# Patient Record
Sex: Male | Born: 1975 | Race: White | Hispanic: No | Marital: Married | Smoking: Never smoker
Health system: Southern US, Community
[De-identification: ages and names within clinical notes are randomized; demographics above are authoritative.]

## PROBLEM LIST (undated history)

## (undated) DIAGNOSIS — Z87442 Personal history of urinary calculi: Secondary | ICD-10-CM

## (undated) HISTORY — PX: WISDOM TOOTH EXTRACTION: SHX21

## (undated) HISTORY — PX: OTHER SURGICAL HISTORY: SHX169

---

## 1998-01-10 ENCOUNTER — Emergency Department (HOSPITAL_COMMUNITY): Admission: EM | Admit: 1998-01-10 | Discharge: 1998-01-10 | Payer: Self-pay | Admitting: Emergency Medicine

## 2012-02-28 ENCOUNTER — Emergency Department (HOSPITAL_COMMUNITY)
Admission: EM | Admit: 2012-02-28 | Discharge: 2012-02-29 | Disposition: A | Discharge status: Home / Self Care | Payer: BC Managed Care – PPO | Attending: Emergency Medicine | Admitting: Emergency Medicine

## 2012-02-28 ENCOUNTER — Emergency Department (HOSPITAL_COMMUNITY): Payer: BC Managed Care – PPO

## 2012-02-28 ENCOUNTER — Encounter (HOSPITAL_COMMUNITY): Payer: Self-pay | Admitting: Emergency Medicine

## 2012-02-28 DIAGNOSIS — N200 Calculus of kidney: Secondary | ICD-10-CM

## 2012-02-28 DIAGNOSIS — R109 Unspecified abdominal pain: Secondary | ICD-10-CM | POA: Insufficient documentation

## 2012-02-28 DIAGNOSIS — N201 Calculus of ureter: Secondary | ICD-10-CM | POA: Insufficient documentation

## 2012-02-28 LAB — URINALYSIS, ROUTINE W REFLEX MICROSCOPIC
Glucose, UA: NEGATIVE mg/dL
Leukocytes, UA: NEGATIVE
Nitrite: NEGATIVE
Specific Gravity, Urine: 1.033 — ABNORMAL HIGH (ref 1.005–1.030)
pH: 5 (ref 5.0–8.0)

## 2012-02-28 LAB — URINE MICROSCOPIC-ADD ON

## 2012-02-28 MED ORDER — HYDROMORPHONE HCL PF 1 MG/ML IJ SOLN
1.0000 mg | Freq: Once | INTRAMUSCULAR | Status: AC
  Administered 2012-02-28: 1 mg via INTRAVENOUS
  Filled 2012-02-28: qty 1

## 2012-02-28 MED ORDER — DEXTROSE 5 % IV SOLN
1.0000 g | Freq: Once | INTRAVENOUS | Status: AC
  Administered 2012-02-29: 1 g via INTRAVENOUS
  Filled 2012-02-28: qty 10

## 2012-02-28 MED ORDER — SODIUM CHLORIDE 0.9 % IV BOLUS (SEPSIS)
1000.0000 mL | Freq: Once | INTRAVENOUS | Status: AC
  Administered 2012-02-28: 1000 mL via INTRAVENOUS

## 2012-02-28 MED ORDER — ONDANSETRON HCL 4 MG/2ML IJ SOLN
4.0000 mg | Freq: Once | INTRAMUSCULAR | Status: AC
  Administered 2012-02-28: 4 mg via INTRAVENOUS
  Filled 2012-02-28: qty 2

## 2012-02-28 MED ORDER — SODIUM CHLORIDE 0.9 % IV SOLN
Freq: Once | INTRAVENOUS | Status: AC
  Administered 2012-02-28: 22:00:00 via INTRAVENOUS

## 2012-02-28 MED ORDER — SODIUM CHLORIDE 0.9 % IV BOLUS (SEPSIS)
1000.0000 mL | Freq: Once | INTRAVENOUS | Status: AC
  Administered 2012-02-29: 1000 mL via INTRAVENOUS

## 2012-02-28 NOTE — ED Notes (Signed)
Pt c/o L flank, LLQ and L testicle pain onset 2 hours, nausea.

## 2012-02-28 NOTE — ED Provider Notes (Signed)
History     CSN: [id number]  Arrival date & time 02/28/12  2131   First MD Initiated Contact with Patient 02/28/12 2201      Chief Complaint  Patient presents with  . Flank Pain    (Consider location/radiation/quality/duration/timing/severity/associated sxs/prior treatment) HPI History provided by pt.   Pt had acute onset severe L-sided flank pain, with radiation to L abdomen and testicles 2 hours ago.  Associated w/ N/V, diaphoresis and lightheadedness.  Had some relief w/ taking a hot bath.  Denies fever, diarrhea and urinary sx.  Denies recent injury.  Has never had pain like this before.   History reviewed. No pertinent past medical history.  Past Surgical History  Procedure Date  . Adnoids     No family history on file.  History  Substance Use Topics  . Smoking status: Never Smoker   . Smokeless tobacco: Not on file  . Alcohol Use: Yes     occasional      Review of Systems  All other systems reviewed and are negative.    Allergies  Review of patient's allergies indicates no known allergies.  Home Medications  No current outpatient prescriptions on file.  BP 135/79  Pulse 62  Temp 97.6 F (36.4 C) (Oral)  Resp 18  Wt 170 lb (77.111 kg)  SpO2 100%  Physical Exam  Nursing note and vitals reviewed. Constitutional: He is oriented to person, place, and time. He appears well-developed and well-nourished. No distress.       Uncomfortable appearing and diaphoretic  HENT:  Head: Normocephalic and atraumatic.  Eyes:       Normal appearance  Neck: Normal range of motion.  Cardiovascular: Normal rate and regular rhythm.   Pulmonary/Chest: Effort normal and breath sounds normal. No respiratory distress.  Abdominal: Soft. Bowel sounds are normal. He exhibits no distension and no mass. There is no tenderness. There is no rebound and no guarding.  Genitourinary:       No CVA tenderness.  Testicles descended bilaterally.  Both non-tender.   Musculoskeletal:  Normal range of motion.  Neurological: He is alert and oriented to person, place, and time.  Skin: Skin is warm and dry. No rash noted.  Psychiatric: He has a normal mood and affect. His behavior is normal.    ED Course  Procedures (including critical care time)  Labs Reviewed  URINALYSIS, ROUTINE W REFLEX MICROSCOPIC - Abnormal; Notable for the following:    Color, Urine AMBER (*)  BIOCHEMICALS MAY BE AFFECTED BY COLOR   APPearance CLOUDY (*)     Specific Gravity, Urine 1.033 (*)     Hgb urine dipstick MODERATE (*)     Bilirubin Urine SMALL (*)     Ketones, ur TRACE (*)     All other components within normal limits  URINE MICROSCOPIC-ADD ON - Abnormal; Notable for the following:    Bacteria, UA MANY (*)     All other components within normal limits  URINE CULTURE   Ct Abdomen Pelvis Wo Contrast  02/29/2012  *RADIOLOGY REPORT*  Clinical Data: Left flank pain  CT ABDOMEN AND PELVIS WITHOUT CONTRAST  Technique:  Multidetector CT imaging of the abdomen and pelvis was performed following the standard protocol without intravenous contrast.  Comparison: None.  Findings: Limited images through the lung bases demonstrate no significant appreciable abnormality. The heart size is within normal limits. No pleural or pericardial effusion.  Organ abnormality/lesion detection is limited in the absence of intravenous contrast. Within this limitation, unremarkable  liver, spleen, pancreas, biliary system, adrenal glands.  There is minimal left hydroureteronephrosis to the level of a 2 mm left UVJ stone.  No additional renal calculi.  No bowel obstruction.  No CT evidence for colitis.  Normal appendix.  No free intraperitoneal air or fluid.  No lymphadenopathy.  Normal caliber vasculature.  Decompressed bladder.  No acute osseous finding.  IMPRESSION: Mild left hydroureteronephrosis to the level of a 2 mm left UVJ stone.   Original Report Authenticated By: Waneta Martins, M.D.      1. Kidney stone        MDM  36yo M presents w/ acute onset severe L flank pain w/ radiation to abd and testicles.  Associated w/ diaphoresis and lightheadedness.  Unremarkable abdominal and testicular exam.  Pt appears very uncomfortable.  Suspect kidney stone + vasovagal reaction.  He has received IV fluids, dilaudid and zofran and sx are improved.  U/A and CT abd/pelvis w/out contrast ordered.  10:30 PM   U/A pos for infection.  Pt reports that pain and nausea have intensified.  Rocephin and a second round of fluids, dilaudid and zofran ordered. 11:18 PM   CT shows 2mm UVJ stone.  Results discussed w/ pt.  He has received a third round of medications and pain/nausea currently controlled.  He is tolerating pos.  D/c'd home w/ percocet, zofran and keflex as well as referral to urology.  Return precautions discussed. 1:46 AM    Otilio Miu, PA 02/29/12 [phone]

## 2012-02-29 MED ORDER — ONDANSETRON HCL 8 MG PO TABS: 8.0000 mg | ORAL_TABLET | Freq: Three times a day (TID) | ORAL | Status: AC | PRN

## 2012-02-29 MED ORDER — KETOROLAC TROMETHAMINE 30 MG/ML IJ SOLN
30.0000 mg | Freq: Once | INTRAMUSCULAR | Status: AC
  Administered 2012-02-29: 30 mg via INTRAVENOUS
  Filled 2012-02-29: qty 1

## 2012-02-29 MED ORDER — ONDANSETRON HCL 4 MG/2ML IJ SOLN
4.0000 mg | Freq: Once | INTRAMUSCULAR | Status: AC
  Administered 2012-02-29: 4 mg via INTRAVENOUS
  Filled 2012-02-29: qty 2

## 2012-02-29 MED ORDER — OXYCODONE-ACETAMINOPHEN 5-325 MG PO TABS: 1.0000 | ORAL_TABLET | ORAL | Status: AC | PRN

## 2012-02-29 MED ORDER — HYDROMORPHONE HCL PF 1 MG/ML IJ SOLN
1.0000 mg | Freq: Once | INTRAMUSCULAR | Status: AC
  Administered 2012-02-29: 1 mg via INTRAVENOUS
  Filled 2012-02-29: qty 1

## 2012-02-29 MED ORDER — CEPHALEXIN 500 MG PO CAPS: 500.0000 mg | ORAL_CAPSULE | Freq: Four times a day (QID) | ORAL | Status: AC

## 2012-03-01 LAB — URINE CULTURE: Culture: NO GROWTH

## 2012-03-01 NOTE — ED Provider Notes (Signed)
Medical screening examination/treatment/procedure(s) were performed by non-physician practitioner and as supervising physician I was immediately available for consultation/collaboration.  Amyra Vantuyl T Destanie Tibbetts, MD 03/01/12 0929 

## 2013-07-12 ENCOUNTER — Other Ambulatory Visit: Payer: Self-pay | Admitting: Orthopaedic Surgery

## 2013-07-13 ENCOUNTER — Encounter (HOSPITAL_BASED_OUTPATIENT_CLINIC_OR_DEPARTMENT_OTHER): Payer: Self-pay | Admitting: *Deleted

## 2013-07-13 NOTE — Progress Notes (Signed)
No labs needed

## 2013-07-13 NOTE — H&P (Signed)
Anthony Watkins is an 38 y.o. male.   Chief Complaint: Right knee ACL tear HPI: Anthony Watkins says his knee feels a bit unstable.  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  He does not trust his knee.  He would like to remain active athletically.  He has been through an MRI scan since the last time he was here.  His past medical, family, and social histories are reviewed and are unchanged based on his intake sheet from 06/03/13.  He has no drug allergies and is on no medications chronically. MRI:  I reviewed an MRI scan films and report of a study done at the [facility] on 06/13/13.  This shows a complete ACL tear with intact meniscal structures.   Past Medical History  Diagnosis Date  . History of kidney stones     Past Surgical History  Procedure Laterality Date  . Adnoids    . Wisdom tooth extraction      History reviewed. No pertinent family history. Social History:  reports that he has never smoked. He does not have any smokeless tobacco history on file. He reports that he drinks alcohol. He reports that he uses illicit drugs (Marijuana).  Allergies: No Known Allergies  No prescriptions prior to admission    No results found for this or any previous visit (from the past 48 hour(s)). No results found.  Review of Systems  Constitutional: Negative.   HENT: Negative.   Eyes: Negative.   Respiratory: Negative.   Cardiovascular: Negative.   Gastrointestinal: Negative.   Musculoskeletal: Positive for joint pain.  Skin: Negative.   Neurological: Negative.   Endo/Heme/Allergies: Negative.   Psychiatric/Behavioral: Negative.     Height 5\' 11"  (1.803 m), weight 77.111 kg (170 lb). Physical Exam  Constitutional: He is oriented to person, place, and time. He appears well-developed and well-nourished.  HENT:  Head: Normocephalic and atraumatic.  Eyes: Conjunctivae are normal. Pupils are equal, round, and reactive to light.  Neck: Normal range of motion. Neck supple.  Cardiovascular:  Normal rate, regular rhythm, normal heart sounds and intact distal pulses.   Respiratory: Effort normal and breath sounds normal.  GI: Soft. Bowel sounds are normal.  Musculoskeletal:  Right knee has trace effusion.  His motion is now about 5-110.  He has Lachman's test which feels loose.  He has some pain to valgus stress but no instability in that direction.  There is some mild medial joint line pain.  Sensation and motor function are intact in his feet with palpable pulses on both sides.  There is no palpable lymphadenopathy behind his knee.   Neurological: He is alert and oriented to person, place, and time. He has normal reflexes.  Skin: Skin is warm and dry.  Psychiatric: He has a normal mood and affect. His behavior is normal. Thought content normal.     Assessment/Plan Right knee ACL tear by MRI Anthony Watkins has a torn ACL.  He would like to get back to skateboarding and basketball and other pivoting sports.  I think the best option for him would be reconstruction.  I reviewed risk of anesthesia, infection, and DVT related to ACL reconstruction.  I think at age 38 an allograft would probably be his best option and I reviewed that in some detail.  He understands about the 6 month recovery process afterwards.    Anthony Watkins R 07/13/2013, 6:03 PM

## 2013-07-15 ENCOUNTER — Other Ambulatory Visit: Payer: Self-pay | Admitting: Orthopaedic Surgery

## 2013-07-19 ENCOUNTER — Ambulatory Visit (HOSPITAL_BASED_OUTPATIENT_CLINIC_OR_DEPARTMENT_OTHER)
Admission: RE | Admit: 2013-07-19 | Discharge: 2013-07-19 | Disposition: A | Discharge status: Home / Self Care | Payer: BC Managed Care – PPO | Source: Ambulatory Visit | Attending: Orthopaedic Surgery | Admitting: Orthopaedic Surgery

## 2013-07-19 ENCOUNTER — Encounter (HOSPITAL_BASED_OUTPATIENT_CLINIC_OR_DEPARTMENT_OTHER)
Admission: RE | Disposition: A | Discharge status: Home / Self Care | Payer: Self-pay | Source: Ambulatory Visit | Attending: Orthopaedic Surgery

## 2013-07-19 ENCOUNTER — Encounter (HOSPITAL_BASED_OUTPATIENT_CLINIC_OR_DEPARTMENT_OTHER): Payer: BC Managed Care – PPO | Admitting: Anesthesiology

## 2013-07-19 ENCOUNTER — Ambulatory Visit (HOSPITAL_BASED_OUTPATIENT_CLINIC_OR_DEPARTMENT_OTHER): Payer: BC Managed Care – PPO | Admitting: Anesthesiology

## 2013-07-19 ENCOUNTER — Encounter (HOSPITAL_BASED_OUTPATIENT_CLINIC_OR_DEPARTMENT_OTHER): Payer: Self-pay | Admitting: *Deleted

## 2013-07-19 DIAGNOSIS — S83509A Sprain of unspecified cruciate ligament of unspecified knee, initial encounter: Secondary | ICD-10-CM | POA: Insufficient documentation

## 2013-07-19 DIAGNOSIS — S83511A Sprain of anterior cruciate ligament of right knee, initial encounter: Secondary | ICD-10-CM

## 2013-07-19 DIAGNOSIS — X58XXXA Exposure to other specified factors, initial encounter: Secondary | ICD-10-CM | POA: Insufficient documentation

## 2013-07-19 DIAGNOSIS — M2240 Chondromalacia patellae, unspecified knee: Secondary | ICD-10-CM | POA: Insufficient documentation

## 2013-07-19 HISTORY — DX: Personal history of urinary calculi: Z87.442

## 2013-07-19 HISTORY — PX: KNEE ARTHROSCOPY WITH ANTERIOR CRUCIATE LIGAMENT (ACL) REPAIR: SHX5644

## 2013-07-19 LAB — POCT HEMOGLOBIN-HEMACUE: HEMOGLOBIN: 16.4 g/dL (ref 13.0–17.0)

## 2013-07-19 SURGERY — KNEE ARTHROSCOPY WITH ANTERIOR CRUCIATE LIGAMENT (ACL) REPAIR
Anesthesia: Regional | Site: Knee | Laterality: Right

## 2013-07-19 MED ORDER — PROPOFOL 10 MG/ML IV BOLUS
INTRAVENOUS | Status: DC | PRN
  Administered 2013-07-19: 200 mg via INTRAVENOUS

## 2013-07-19 MED ORDER — HYDROMORPHONE HCL PF 1 MG/ML IJ SOLN: 0.2500 mg | INTRAMUSCULAR | Status: DC | PRN

## 2013-07-19 MED ORDER — MIDAZOLAM HCL 2 MG/2ML IJ SOLN
INTRAMUSCULAR | Status: AC
  Filled 2013-07-19: qty 2

## 2013-07-19 MED ORDER — SODIUM CHLORIDE 0.9 % IR SOLN
Status: DC | PRN
  Administered 2013-07-19: 6000 mL

## 2013-07-19 MED ORDER — PROPOFOL 10 MG/ML IV EMUL
INTRAVENOUS | Status: AC
  Filled 2013-07-19: qty 50

## 2013-07-19 MED ORDER — SUCCINYLCHOLINE CHLORIDE 20 MG/ML IJ SOLN
INTRAMUSCULAR | Status: AC
  Filled 2013-07-19: qty 1

## 2013-07-19 MED ORDER — LACTATED RINGERS IV SOLN
INTRAVENOUS | Status: DC
  Administered 2013-07-19 (×2): via INTRAVENOUS

## 2013-07-19 MED ORDER — MIDAZOLAM HCL 5 MG/5ML IJ SOLN
INTRAMUSCULAR | Status: DC | PRN
  Administered 2013-07-19: 2 mg via INTRAVENOUS

## 2013-07-19 MED ORDER — OXYCODONE HCL 5 MG/5ML PO SOLN: 5.0000 mg | Freq: Once | ORAL | Status: AC | PRN

## 2013-07-19 MED ORDER — LACTATED RINGERS IV SOLN: INTRAVENOUS | Status: DC

## 2013-07-19 MED ORDER — CEFAZOLIN SODIUM-DEXTROSE 2-3 GM-% IV SOLR
2.0000 g | INTRAVENOUS | Status: AC
  Administered 2013-07-19: 2 g via INTRAVENOUS

## 2013-07-19 MED ORDER — HYDROCODONE-ACETAMINOPHEN 5-325 MG PO TABS: 1.0000 | ORAL_TABLET | Freq: Four times a day (QID) | ORAL | Status: DC | PRN

## 2013-07-19 MED ORDER — BUPIVACAINE-EPINEPHRINE PF 0.5-1:200000 % IJ SOLN
INTRAMUSCULAR | Status: DC | PRN
  Administered 2013-07-19: 30 mL via PERINEURAL

## 2013-07-19 MED ORDER — CEFAZOLIN SODIUM-DEXTROSE 2-3 GM-% IV SOLR
INTRAVENOUS | Status: AC
  Filled 2013-07-19: qty 50

## 2013-07-19 MED ORDER — KETOROLAC TROMETHAMINE 30 MG/ML IJ SOLN
INTRAMUSCULAR | Status: DC | PRN
  Administered 2013-07-19: 30 mg via INTRAVENOUS

## 2013-07-19 MED ORDER — OXYCODONE HCL 5 MG PO TABS
5.0000 mg | ORAL_TABLET | Freq: Once | ORAL | Status: AC | PRN
  Administered 2013-07-19: 5 mg via ORAL
  Filled 2013-07-19: qty 1

## 2013-07-19 MED ORDER — MIDAZOLAM HCL 2 MG/2ML IJ SOLN
1.0000 mg | INTRAMUSCULAR | Status: DC | PRN
  Administered 2013-07-19: 2 mg via INTRAVENOUS

## 2013-07-19 MED ORDER — LIDOCAINE HCL (CARDIAC) 20 MG/ML IV SOLN
INTRAVENOUS | Status: DC | PRN
  Administered 2013-07-19: 50 mg via INTRAVENOUS

## 2013-07-19 MED ORDER — SODIUM CHLORIDE 0.9 % IV SOLN
INTRAVENOUS | Status: DC | PRN
  Administered 2013-07-19: 14000 mL

## 2013-07-19 MED ORDER — FENTANYL CITRATE 0.05 MG/ML IJ SOLN
INTRAMUSCULAR | Status: AC
  Filled 2013-07-19: qty 2

## 2013-07-19 MED ORDER — DEXAMETHASONE SODIUM PHOSPHATE 4 MG/ML IJ SOLN
INTRAMUSCULAR | Status: DC | PRN
  Administered 2013-07-19: 10 mg via INTRAVENOUS

## 2013-07-19 MED ORDER — FENTANYL CITRATE 0.05 MG/ML IJ SOLN
INTRAMUSCULAR | Status: DC | PRN
  Administered 2013-07-19: 50 ug via INTRAVENOUS

## 2013-07-19 MED ORDER — FENTANYL CITRATE 0.05 MG/ML IJ SOLN
INTRAMUSCULAR | Status: AC
  Filled 2013-07-19: qty 6

## 2013-07-19 MED ORDER — FENTANYL CITRATE 0.05 MG/ML IJ SOLN
50.0000 ug | INTRAMUSCULAR | Status: DC | PRN
  Administered 2013-07-19: 100 ug via INTRAVENOUS

## 2013-07-19 MED ORDER — CHLORHEXIDINE GLUCONATE 4 % EX LIQD: 60.0000 mL | Freq: Once | CUTANEOUS | Status: DC

## 2013-07-19 MED ORDER — ONDANSETRON HCL 4 MG/2ML IJ SOLN
INTRAMUSCULAR | Status: DC | PRN
  Administered 2013-07-19: 4 mg via INTRAVENOUS

## 2013-07-19 SURGICAL SUPPLY — 79 items
BANDAGE ELASTIC 6 VELCRO ST LF (GAUZE/BANDAGES/DRESSINGS) ×2 IMPLANT
BANDAGE ESMARK 6X9 LF (GAUZE/BANDAGES/DRESSINGS) IMPLANT
BENZOIN TINCTURE PRP APPL 2/3 (GAUZE/BANDAGES/DRESSINGS) IMPLANT
BLADE AVERAGE 25X9 (BLADE) ×2 IMPLANT
BLADE CUDA 5.5 (BLADE) IMPLANT
BLADE GREAT WHITE 4.2 (BLADE) ×2 IMPLANT
BLADE OSCIL/SAGITTAL W/10 ST (BLADE) IMPLANT
BLADE SURG 15 STRL LF DISP TIS (BLADE) IMPLANT
BLADE SURG 15 STRL SS (BLADE)
BNDG ESMARK 6X9 LF (GAUZE/BANDAGES/DRESSINGS)
BNDG GAUZE ELAST 4 BULKY (GAUZE/BANDAGES/DRESSINGS) ×2 IMPLANT
BONE TUNNEL PLUG CANNULATED (MISCELLANEOUS) ×2 IMPLANT
BUR VERTEX HOODED 4.5 (BURR) ×2 IMPLANT
CANISTER SUCT 3000ML (MISCELLANEOUS) IMPLANT
COVER TABLE BACK 60X90 (DRAPES) ×2 IMPLANT
CUFF TOURNIQUET SINGLE 34IN LL (TOURNIQUET CUFF) IMPLANT
DECANTER SPIKE VIAL GLASS SM (MISCELLANEOUS) IMPLANT
DRAPE ARTHROSCOPY W/POUCH 114 (DRAPES) ×2 IMPLANT
DRAPE INCISE IOBAN 66X45 STRL (DRAPES) ×2 IMPLANT
DRAPE U-SHAPE 47X51 STRL (DRAPES) ×2 IMPLANT
DRSG EMULSION OIL 3X3 NADH (GAUZE/BANDAGES/DRESSINGS) ×4 IMPLANT
DURAPREP 26ML APPLICATOR (WOUND CARE) ×2 IMPLANT
ELECT MENISCUS 165MM 90D (ELECTRODE) IMPLANT
ELECT REM PT RETURN 9FT ADLT (ELECTROSURGICAL) ×2
ELECTRODE REM PT RTRN 9FT ADLT (ELECTROSURGICAL) ×1 IMPLANT
FIBERSTICK 2 (SUTURE) IMPLANT
GAUZE SPONGE 4X4 16PLY XRAY LF (GAUZE/BANDAGES/DRESSINGS) IMPLANT
GLOVE BIO SURGEON STRL SZ8.5 (GLOVE) ×2 IMPLANT
GLOVE BIOGEL PI IND STRL 7.0 (GLOVE) ×1 IMPLANT
GLOVE BIOGEL PI IND STRL 8 (GLOVE) ×1 IMPLANT
GLOVE BIOGEL PI IND STRL 8.5 (GLOVE) ×1 IMPLANT
GLOVE BIOGEL PI INDICATOR 7.0 (GLOVE) ×1
GLOVE BIOGEL PI INDICATOR 8 (GLOVE) ×1
GLOVE BIOGEL PI INDICATOR 8.5 (GLOVE) ×1
GLOVE ECLIPSE 6.5 STRL STRAW (GLOVE) ×2 IMPLANT
GLOVE EXAM NITRILE MD LF STRL (GLOVE) ×2 IMPLANT
GLOVE SS BIOGEL STRL SZ 8 (GLOVE) ×1 IMPLANT
GLOVE SUPERSENSE BIOGEL SZ 8 (GLOVE) ×1
GOWN STRL REUS W/ TWL LRG LVL3 (GOWN DISPOSABLE) ×1 IMPLANT
GOWN STRL REUS W/ TWL XL LVL3 (GOWN DISPOSABLE) ×1 IMPLANT
GOWN STRL REUS W/TWL LRG LVL3 (GOWN DISPOSABLE) ×1
GOWN STRL REUS W/TWL XL LVL3 (GOWN DISPOSABLE) ×1
IMMOBILIZER KNEE 22 (SOFTGOODS) IMPLANT
IMMOBILIZER KNEE 24 ADJ (MISCELLANEOUS) ×2 IMPLANT
IV NS IRRIG 3000ML ARTHROMATIC (IV SOLUTION) ×4 IMPLANT
KIT TRANSTIBIAL (DISPOSABLE) IMPLANT
KNEE WRAP E Z 3 GEL PACK (MISCELLANEOUS) ×2 IMPLANT
MANIFOLD NEPTUNE II (INSTRUMENTS) ×2 IMPLANT
NS IRRIG 1000ML POUR BTL (IV SOLUTION) ×2 IMPLANT
PACK ARTHROSCOPY DSU (CUSTOM PROCEDURE TRAY) ×2 IMPLANT
PACK BASIN DAY SURGERY FS (CUSTOM PROCEDURE TRAY) ×2 IMPLANT
PATELLA LIGAMENT BISECTED FR (Tissue) ×2 IMPLANT
PENCIL BUTTON HOLSTER BLD 10FT (ELECTRODE) ×2 IMPLANT
SCREW PROPEL 7X20MM (Screw) ×2 IMPLANT
SCREW PROPEL 7X25MM (Screw) ×2 IMPLANT
SET ARTHROSCOPY TUBING (MISCELLANEOUS) ×1
SET ARTHROSCOPY TUBING LN (MISCELLANEOUS) ×1 IMPLANT
SHEET MEDIUM DRAPE 40X70 STRL (DRAPES) ×2 IMPLANT
SLEEVE SCD COMPRESS KNEE MED (MISCELLANEOUS) ×2 IMPLANT
SPONGE GAUZE 4X4 12PLY (GAUZE/BANDAGES/DRESSINGS) ×2 IMPLANT
SPONGE LAP 4X18 X RAY DECT (DISPOSABLE) ×2 IMPLANT
STRIP CLOSURE SKIN 1/2X4 (GAUZE/BANDAGES/DRESSINGS) IMPLANT
SUCTION FRAZIER TIP 10 FR DISP (SUCTIONS) IMPLANT
SUT 2 FIBERLOOP 20 STRT BLUE (SUTURE)
SUT ETHILON 4 0 PS 2 18 (SUTURE) ×2 IMPLANT
SUT PDS AB 1 CT  36 (SUTURE) ×2
SUT PDS AB 1 CT 36 (SUTURE) ×2 IMPLANT
SUT STEEL 5 (SUTURE) ×2 IMPLANT
SUT VIC AB 0 CT1 27 (SUTURE)
SUT VIC AB 0 CT1 27XBRD ANBCTR (SUTURE) IMPLANT
SUT VIC AB 2-0 SH 27 (SUTURE)
SUT VIC AB 2-0 SH 27XBRD (SUTURE) IMPLANT
SUT VIC AB 3-0 FS2 27 (SUTURE) IMPLANT
SUTURE 2 FIBERLOOP 20 STRT BLU (SUTURE) IMPLANT
SYR 3ML 18GX1 1/2 (SYRINGE) IMPLANT
TOWEL OR 17X24 6PK STRL BLUE (TOWEL DISPOSABLE) ×2 IMPLANT
TOWEL OR NON WOVEN STRL DISP B (DISPOSABLE) ×2 IMPLANT
WAND 30 DEG SABER W/CORD (SURGICAL WAND) IMPLANT
WATER STERILE IRR 1000ML POUR (IV SOLUTION) ×2 IMPLANT

## 2013-07-19 NOTE — Discharge Instructions (Addendum)
CPM-0-60 and advance as tolerated. May use this machine 6 hours per day. May change dressing as needed. If no drainage leave dressing on until follow-up appointment. Do not get incisions wet until seen for follow-up. Continue ice and elevation. May weight-bear as tolerated crutches as needed.  Post Anesthesia Home Care Instructions  Activity: Get plenty of rest for the remainder of the day. A responsible adult should stay with you for 24 hours following the procedure.  For the next 24 hours, DO NOT: -Drive a car -Advertising copywriterperate machinery -Drink alcoholic beverages -Take any medication unless instructed by your physician -Make any legal decisions or sign important papers.  Meals: Start with liquid foods such as gelatin or soup. Progress to regular foods as tolerated. Avoid greasy, spicy, heavy foods. If nausea and/or vomiting occur, drink only clear liquids until the nausea and/or vomiting subsides. Call your physician if vomiting continues.  Special Instructions/Symptoms: Your throat may feel dry or sore from the anesthesia or the breathing tube placed in your throat during surgery. If this causes discomfort, gargle with warm salt water. The discomfort should disappear within 24 hours.   Regional Anesthesia Blocks  1. Numbness or the inability to move the "blocked" extremity may last from 3-48 hours after placement. The length of time depends on the medication injected and your individual response to the medication. If the numbness is not going away after 48 hours, call your surgeon.  2. The extremity that is blocked will need to be protected until the numbness is gone and the  Strength has returned. Because you cannot feel it, you will need to take extra care to avoid injury. Because it may be weak, you may have difficulty moving it or using it. You may not know what position it is in without looking at it while the block is in effect.  3. For blocks in the legs and feet, returning to weight  bearing and walking needs to be done carefully. You will need to wait until the numbness is entirely gone and the strength has returned. You should be able to move your leg and foot normally before you try and bear weight or walk. You will need someone to be with you when you first try to ensure you do not fall and possibly risk injury.  4. Bruising and tenderness at the needle site are common side effects and will resolve in a few days.  5. Persistent numbness or new problems with movement should be communicated to the surgeon or the [facility] [phone])/ [facility] [phone]).     Arthroscopic Knee Surgery Post -OP Instructions  You have just had an arthroscopic operation. Your incisions (puncture sites) are small and should heal quickly. The structures inside your knee may take 6-8 weeks to heal and settle down. This healing time is variable and may range from a few days to 6-8 weeks.  Pain Medication: You will be given a prescription for pain medication. Please take the medication as needed. Most patients require pain medication for only a few days.  Swelling: You can expect some swelling in your knee. Applying ice and elevating your leg will help keep the swelling to a minimum. Ice can be applied by placing ice cubes in a plastic bag and putting the bag on your knee with a towel between the ice bag and your knee. Sometimes you will be issued an ice pack wrap and that will work as well. Your entire leg should be elevated, not  just your knee. Elevate your leg to or above the level of your heart. The ice should be used periodically during the first 48 hours along with continued elevation of your leg. The swelling should subside over the next several weeks.  Dressing: Fluid leakage is common the first night. Precautions to prevent staining of your clothes, bed sheets, etc., should be taken. This fluid was used to inflate the joint during surgery and is  commonly tinged red from a small amount of blood. Remove your dressing tomorrow. Some bleeding or leakage from the puncture sites may occur for a few days and you should cover the puncture sites with Band-Aids until the leakage stops.Alternatively you may reapply the ace wrap with some gauze pads over the puncture sites but don't wrap it too tightly.  You may shower 2 days after surgery.   Activity: You may bend and straighten your knee as soon as it is comfortable to do so. Using your knee will help decrease swelling and help prevent stiffness, as long as you dont overdo it. Moving your foot up and down also helps decrease the swelling. There is no harm in putting weight on; your leg as long as it is comfortable to do so. (You should not, try to run or jump). Gradually increase activity as you can tolerate. An increase in pain or swelling with certain activities or with increased activities may indicate you are doing too much. Back up and start building up your activities at a slower rate. You may need crutches for a few days.  Office Check-Up: If no major problems arise and you are progressing well, we will need to see you in the office in one (1) week unless otherwise instructed by your doctor. Please call the office to make an appointment.

## 2013-07-19 NOTE — Progress Notes (Signed)
Assisted Dr. Fitzgerald with right, ultrasound guided, femoral block. Side rails up, monitors on throughout procedure. See vital signs in flow sheet. Tolerated Procedure well. 

## 2013-07-19 NOTE — Interval H&P Note (Signed)
History and Physical Interval Note:  07/19/2013 7:23 AM  Anthony Watkins  has presented today for surgery, with the diagnosis of RIGHT ACL TEAR;CHONDROMALACIA  The various methods of treatment have been discussed with the patient and family. After consideration of risks, benefits and other options for treatment, the patient has consented to  Procedure(s): RIGHT KNEE ARTHROSCOPY WITH ANTERIOR CRUCIATE LIGAMENT (ACL) REPAIR (Right) as a surgical intervention .  The patient's history has been reviewed, patient examined, no change in status, stable for surgery.  I have reviewed the patient's chart and labs.  Questions were answered to the patient's satisfaction.     Larue Lightner G

## 2013-07-19 NOTE — Anesthesia Preprocedure Evaluation (Signed)
Anesthesia Evaluation  Patient identified by MRN, date of birth, ID band Patient awake    Reviewed: Allergy & Precautions, H&P , NPO status , Patient's Chart, lab work & pertinent test results  Airway Mallampati: II TM Distance: >3 FB Neck ROM: Full    Dental no notable dental hx. (+) Teeth Intact and Dental Advisory Given   Pulmonary neg pulmonary ROS,  breath sounds clear to auscultation  Pulmonary exam normal       Cardiovascular negative cardio ROS  Rate:Normal     Neuro/Psych negative neurological ROS  negative psych ROS   GI/Hepatic negative GI ROS, Neg liver ROS,   Endo/Other  negative endocrine ROS  Renal/GU negative Renal ROS  negative genitourinary   Musculoskeletal   Abdominal   Peds  Hematology negative hematology ROS (+)   Anesthesia Other Findings   Reproductive/Obstetrics negative OB ROS                           Anesthesia Physical Anesthesia Plan  ASA: I  Anesthesia Plan: General and Regional   Post-op Pain Management:    Induction: Intravenous  Airway Management Planned: LMA  Additional Equipment:   Intra-op Plan:   Post-operative Plan: Extubation in OR  Informed Consent: I have reviewed the patients History and Physical, chart, labs and discussed the procedure including the risks, benefits and alternatives for the proposed anesthesia with the patient or authorized representative who has indicated his/her understanding and acceptance.   Dental advisory given  Plan Discussed with: CRNA  Anesthesia Plan Comments:         Anesthesia Quick Evaluation

## 2013-07-19 NOTE — Anesthesia Procedure Notes (Addendum)
Anesthesia Regional Block:  Femoral nerve block  Pre-Anesthetic Checklist: ,, timeout performed, Correct Patient, Correct Site, Correct Laterality, Correct Procedure, Correct Position, site marked, Risks and benefits discussed, pre-op evaluation,  At surgeon's request and post-op pain management  Laterality: Right  Prep: Maximum Sterile Barrier Precautions used and chloraprep       Needles:  Injection technique: Single-shot  Needle Type: Echogenic Stimulator Needle     Needle Length: 5cm 5 cm Needle Gauge: 22 and 22 G    Additional Needles:  Procedures: ultrasound guided (picture in chart) Femoral nerve block Narrative:  Start time: 07/19/2013 6:52 AM End time: 07/19/2013 7:02 AM Injection made incrementally with aspirations every 5 mL. Anesthesiologist: Fitzgerald,MD  Additional Notes: 2% Lidocaine skin wheel.    Procedure Name: LMA Insertion Date/Time: 07/19/2013 7:38 AM Performed by: Zenia ResidesPAYNE, Turrell Severt D Pre-anesthesia Checklist: Patient identified, Emergency Drugs available, Suction available and Patient being monitored Patient Re-evaluated:Patient Re-evaluated prior to inductionOxygen Delivery Method: Circle System Utilized Preoxygenation: Pre-oxygenation with 100% oxygen Intubation Type: IV induction Ventilation: Mask ventilation without difficulty LMA: LMA inserted LMA Size: 4.0 Grade View: Grade II Number of attempts: 1 Airway Equipment and Method: bite block Placement Confirmation: positive ETCO2 and breath sounds checked- equal and bilateral Tube secured with: Tape Dental Injury: Teeth and Oropharynx as per pre-operative assessment

## 2013-07-19 NOTE — Anesthesia Postprocedure Evaluation (Signed)
  Anesthesia Post-op Note  Patient: Anthony LyeJoshua A Huyett  Procedure(s) Performed: Procedure(s): RIGHT KNEE ARTHROSCOPY WITH ANTERIOR CRUCIATE LIGAMENT (ACL) REPAIR (Right)  Patient Location: PACU  Anesthesia Type:General and block  Level of Consciousness: awake and alert   Airway and Oxygen Therapy: Patient Spontanous Breathing  Post-op Pain: mild  Post-op Assessment: Post-op Vital signs reviewed, Patient's Cardiovascular Status Stable and Respiratory Function Stable  Post-op Vital Signs: Reviewed  Filed Vitals:   07/19/13 0945  BP: 129/87  Pulse: 72  Temp:   Resp: 13    Complications: No apparent anesthesia complications

## 2013-07-19 NOTE — Transfer of Care (Signed)
Immediate Anesthesia Transfer of Care Note  Patient: Anthony LyeJoshua A Shostak  Procedure(s) Performed: Procedure(s): RIGHT KNEE ARTHROSCOPY WITH ANTERIOR CRUCIATE LIGAMENT (ACL) REPAIR (Right)  Patient Location: PACU  Anesthesia Type:General and Regional  Level of Consciousness: awake  Airway & Oxygen Therapy: Patient Spontanous Breathing and Patient connected to face mask oxygen  Post-op Assessment: Report given to PACU RN and Post -op Vital signs reviewed and stable  Post vital signs: Reviewed and stable  Complications: No apparent anesthesia complications

## 2013-07-19 NOTE — Op Note (Signed)
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 male with an unstable knee.  The patient has failed non-operative measures and has a knee that does not allow for participation in desired activities.  The patient is offered ACL reconstruction in hopes of stabilizing the knee.  Associated conditions are to be addressed as well.  Informed operative consent was obtained after discussion of risks including reaction to anesthesia, infection, DVT, and stiffness.  The importance of the post-operative rehabilitation protocol to optimize result was stressed extensively with the patient.  SUMMARY OF FINDINGS AND PROCEDURE:  Anthony Watkins was taken to the operative suite where under the above anesthesia a knee arthroscopy and ACL reconstruction was performed. The suprapatellar pouch was benign while the patellofemoral joint showed mild articular cartilage damage.  The medial compartment was notable for no articular cartilage damage and no meniscal pathology.  The ACL was torn and the PCL was intact.  The lateral compartment was notable for noarticular cartilage damage and no meniscal pathology.  The meniscal and articular cartilage problems were addressed with chondroplasty of the patella. We used patellar tendon allograft material and stabilized at both ends with metal L[employer] screws.   Anthony Pate PA assisted throughout and was invaluable to the completion of the case in that he positioned and retracted and also fashioned the graft on the back table while I performed arthroscopic portions of the case thereby significantly minimizing OR time.  The patient was scheduled to stay overnight at but might go home depending on condition in the recovery  room.  DESCRIPTION OF PROCEDURE:  Anthony Watkins was taken to the operative suite where the above anesthetic was applied.  The patient was positioned supine and prepped and draped in normal sterile fashion.  An appropriate time out was performed.  After the administration of Kefzol pre-operative antibiotic and arthroscopy of the knee was performed. Findings were as noted above and appropriate articular and meniscal cartilage work was done.  The ACL reconstruction was then performed utilizing the above mentioned material.  We harvested the middle third of the patellar tendon through a longitudinal incision and dissection through peritenon.  A saw was used to create contiguous bone plugs from the tibial tubercle and patella. A conservative notch-plasty was done with a burr.  A tourniquet was not utilized.  We prepared the aforementioned graft with saw and drill to fit through planned tunnels and bone plugs were fashioned to be one mm smaller than tunnels.  A guide was placed in the knee anterior to the PCL near the ACL footprint and utilized to place a guide wire up into the knee.  Over this I reamed to a diameter of 11 mm.  A second guide was placed through the medial portal low on the femur at the ACL footprint there and utilized for placement of a guide pin through the femur and out the lateral thigh.  Over this I reamed a femoral tunnel to a diameter of 9.5 mm and depth of 2 cm.  Bony debris was removed from the knee with the shaver.  The aforementioned graft was pulled through the tibial tunnel into the femoral tunnel with care taken to keep the tendinous portion of the graft in an anterior position as  it entered the femoral tunnel.  I placed a guidewire anterior in the femoral tunnel and over this placed a 7x20 mm interference screw.  The knee was ranged and the graft was felt to be very isometric.  Another guidewire was placed through the tibial tunnel and seen to enter the knee arthroscopically.  Over this  I placed another interference screw which was  7x25 mm in size.  The knee was again ranged and easily came to full extension with no impingement.  Arthroscopic equipment was removed at this point.  In case of patellar tendon autograft peritenon was closed with #0 vicryl followed by subcutaneous re-approximation in both allograft and autograft cases using 2-0 undyed vicryl and skin closure with nylon.  Adaptic was applied along with a sterile dressing.  Estimated blood loss and intraoperative fluids can be obtained from anesthesia records.  DISPOSITION:  The patient was extubated in the operating room and taken to recovery room in stable condition.  Plans were to stay overnight though the patient might be able to go home same day depending on condition in recovery.    Aislin Onofre G 07/19/2013, 8:53 AM

## 2013-07-21 ENCOUNTER — Encounter (HOSPITAL_BASED_OUTPATIENT_CLINIC_OR_DEPARTMENT_OTHER): Payer: Self-pay | Admitting: Orthopaedic Surgery

## 2014-05-03 ENCOUNTER — Ambulatory Visit (INDEPENDENT_AMBULATORY_CARE_PROVIDER_SITE_OTHER): Payer: BC Managed Care – PPO

## 2014-05-03 ENCOUNTER — Ambulatory Visit (INDEPENDENT_AMBULATORY_CARE_PROVIDER_SITE_OTHER): Payer: BC Managed Care – PPO | Admitting: Emergency Medicine

## 2014-05-03 VITALS — BP 124/74 | HR 90 | Temp 98.0°F | Resp 16 | Ht 70.0 in | Wt 163.0 lb

## 2014-05-03 DIAGNOSIS — J22 Unspecified acute lower respiratory infection: Secondary | ICD-10-CM

## 2014-05-03 DIAGNOSIS — R0989 Other specified symptoms and signs involving the circulatory and respiratory systems: Secondary | ICD-10-CM

## 2014-05-03 DIAGNOSIS — R5383 Other fatigue: Secondary | ICD-10-CM

## 2014-05-03 DIAGNOSIS — R05 Cough: Secondary | ICD-10-CM

## 2014-05-03 DIAGNOSIS — R0602 Shortness of breath: Secondary | ICD-10-CM

## 2014-05-03 DIAGNOSIS — R062 Wheezing: Secondary | ICD-10-CM

## 2014-05-03 DIAGNOSIS — Z9889 Other specified postprocedural states: Secondary | ICD-10-CM | POA: Insufficient documentation

## 2014-05-03 LAB — POCT CBC
Granulocyte percent: 57.5 %G (ref 37–80)
HEMATOCRIT: 53.4 % (ref 43.5–53.7)
HEMOGLOBIN: 17.9 g/dL (ref 14.1–18.1)
LYMPH, POC: 2.4 (ref 0.6–3.4)
MCH, POC: 30.7 pg (ref 27–31.2)
MCHC: 33.5 g/dL (ref 31.8–35.4)
MCV: 91.8 fL (ref 80–97)
MID (cbc): 0.7 (ref 0–0.9)
MPV: 8.4 fL (ref 0–99.8)
POC GRANULOCYTE: 4.2 (ref 2–6.9)
POC LYMPH %: 32.6 % (ref 10–50)
POC MID %: 9.9 %M (ref 0–12)
Platelet Count, POC: 224 10*3/uL (ref 142–424)
RBC: 5.82 M/uL (ref 4.69–6.13)
RDW, POC: 12.6 %
WBC: 7.3 10*3/uL (ref 4.6–10.2)

## 2014-05-03 MED ORDER — ALBUTEROL SULFATE HFA 108 (90 BASE) MCG/ACT IN AERS: 2.0000 | INHALATION_SPRAY | RESPIRATORY_TRACT | Status: AC | PRN

## 2014-05-03 MED ORDER — IPRATROPIUM BROMIDE 0.02 % IN SOLN
0.5000 mg | Freq: Once | RESPIRATORY_TRACT | Status: AC
  Administered 2014-05-03: 0.5 mg via RESPIRATORY_TRACT

## 2014-05-03 MED ORDER — PREDNISONE 20 MG PO TABS: ORAL_TABLET | ORAL | Status: DC

## 2014-05-03 MED ORDER — ALBUTEROL SULFATE (2.5 MG/3ML) 0.083% IN NEBU
2.5000 mg | INHALATION_SOLUTION | Freq: Once | RESPIRATORY_TRACT | Status: AC
  Administered 2014-05-03: 2.5 mg via RESPIRATORY_TRACT

## 2014-05-03 MED ORDER — AZITHROMYCIN 250 MG PO TABS: ORAL_TABLET | ORAL | Status: DC

## 2014-05-03 NOTE — Patient Instructions (Signed)
Albuterol inhalation aerosol °What is this medicine? °ALBUTEROL (al BYOO ter ole) is a bronchodilator. It helps open up the airways in your lungs to make it easier to breathe. This medicine is used to treat and to prevent bronchospasm. °This medicine may be used for other purposes; ask your health care provider or pharmacist if you have questions. °COMMON BRAND NAME(S): Proair HFA, Proventil, Proventil HFA, Respirol, Ventolin, Ventolin HFA °What should I tell my health care provider before I take this medicine? °They need to know if you have any of the following conditions: °-diabetes °-heart disease or irregular heartbeat °-high blood pressure °-pheochromocytoma °-seizures °-thyroid disease °-an unusual or allergic reaction to albuterol, levalbuterol, sulfites, other medicines, foods, dyes, or preservatives °-pregnant or trying to get pregnant °-breast-feeding °How should I use this medicine? °This medicine is for inhalation through the mouth. Follow the directions on your prescription label. Take your medicine at regular intervals. Do not use more often than directed. Make sure that you are using your inhaler correctly. Ask you doctor or health care provider if you have any questions. °Talk to your pediatrician regarding the use of this medicine in children. Special care may be needed. °Overdosage: If you think you have taken too much of this medicine contact a poison control center or emergency room at once. °NOTE: This medicine is only for you. Do not share this medicine with others. °What if I miss a dose? °If you miss a dose, use it as soon as you can. If it is almost time for your next dose, use only that dose. Do not use double or extra doses. °What may interact with this medicine? °-anti-infectives like chloroquine and pentamidine °-caffeine °-cisapride °-diuretics °-medicines for colds °-medicines for depression or for emotional or psychotic conditions °-medicines for weight loss including some herbal  products °-methadone °-some antibiotics like clarithromycin, erythromycin, levofloxacin, and linezolid °-some heart medicines °-steroid hormones like dexamethasone, cortisone, hydrocortisone °-theophylline °-thyroid hormones °This list may not describe all possible interactions. Give your health care provider a list of all the medicines, herbs, non-prescription drugs, or dietary supplements you use. Also tell them if you smoke, drink alcohol, or use illegal drugs. Some items may interact with your medicine. °What should I watch for while using this medicine? °Tell your doctor or health care professional if your symptoms do not improve. Do not use extra albuterol. If your asthma or bronchitis gets worse while you are using this medicine, call your doctor right away. °If your mouth gets dry try chewing sugarless gum or sucking hard candy. Drink water as directed. °What side effects may I notice from receiving this medicine? °Side effects that you should report to your doctor or health care professional as soon as possible: °-allergic reactions like skin rash, itching or hives, swelling of the face, lips, or tongue °-breathing problems °-chest pain °-feeling faint or lightheaded, falls °-high blood pressure °-irregular heartbeat °-fever °-muscle cramps or weakness °-pain, tingling, numbness in the hands or feet °-vomiting °Side effects that usually do not require medical attention (report to your doctor or health care professional if they continue or are bothersome): °-cough °-difficulty sleeping °-headache °-nervousness or trembling °-stomach upset °-stuffy or runny nose °-throat irritation °-unusual taste °This list may not describe all possible side effects. Call your doctor for medical advice about side effects. You may report side effects to FDA at 1-800-FDA-1088. °Where should I keep my medicine? °Keep out of the reach of children. °Store at room temperature between 15 and 30 degrees   C (59 and 86 degrees F). The  contents are under pressure and may burst when exposed to heat or flame. Do not freeze. This medicine does not work as well if it is too cold. Throw away any unused medicine after the expiration date. Inhalers need to be thrown away after the labeled number of puffs have been used or by the expiration date; whichever comes first. Ventolin HFA should be thrown away 12 months after removing from foil pouch. Check the instructions that come with your medicine. °NOTE: This sheet is a summary. It may not cover all possible information. If you have questions about this medicine, talk to your doctor, pharmacist, or health care provider. °© 2015, [employer]. (2012-11-18 10:57:17) ° ° °

## 2014-05-03 NOTE — Progress Notes (Signed)
Subjective:    Patient ID: Anthony LyeJoshua A Caldera, male    DOB: 07-19-1975, 38 y.o.   MRN: [id number]  HPI Patient presents with 13 days of of productive cough that has gotten progressively worse. Sx started Nov. 5 (the day before his wedding) with rhinorrhea and sneezing, but has progressed to cough and difficulty breathing and wheezing when laying down. Denies fever, chills, or sinus pressure. 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 Denies h/o of asthma, but has seasonal allergies that are controlled. Due to living situation has been sleeping on the couch where dog usually sleeps for past week. Has tried DayQuil with no relief and AlkaSeltzer that is helping him sleep. No allergies to any medications.  PFSH reviewed for this encounter.   Review of Systems  Constitutional: Positive for fatigue. Negative for fever, chills, activity change and appetite change.  HENT: Positive for congestion, rhinorrhea and sneezing. Negative for ear discharge, ear pain, sinus pressure and sore throat.   Eyes: Negative for pain, discharge and itching.  Respiratory: Positive for cough, shortness of breath (when laying down) and wheezing (when laying down). Negative for chest tightness.   Cardiovascular: Negative for chest pain.  Gastrointestinal: Negative for nausea, vomiting and abdominal pain.  Musculoskeletal: Negative for myalgias, neck pain and neck stiffness.  Allergic/Immunologic: Positive for environmental allergies. Negative for food allergies.  Neurological: Negative for dizziness, light-headedness and headaches.  Hematological: Negative for adenopathy.       Objective:   Physical Exam  Constitutional: He is oriented to person, place, and time. He appears well-developed and well-nourished. No distress.  Blood pressure 124/74, pulse 90, temperature 98 F (36.7 C), temperature source Oral, resp. rate 16, height 5\' 10"  (1.778 m), weight 163 lb (73.936 kg), SpO2 98 %.   HENT:  Head: Normocephalic  and atraumatic.  Right Ear: External ear normal.  Left Ear: External ear normal.  Mouth/Throat: Oropharynx is clear and moist. No oropharyngeal exudate.  Eyes: Conjunctivae are normal. Pupils are equal, round, and reactive to light. Right eye exhibits no discharge. Left eye exhibits no discharge. No scleral icterus.  Neck: Normal range of motion. Neck supple.  Cardiovascular: Normal rate, regular rhythm and normal heart sounds.  Exam reveals no gallop and no friction rub.   No murmur heard. Pulmonary/Chest: Effort normal. No respiratory distress. He has no decreased breath sounds. He has wheezes in the left middle field. He has no rhonchi. He has rales in the right middle field, the right lower field, the left middle field and the left lower field.  Abdominal: Soft. Bowel sounds are normal. He exhibits no mass. There is no tenderness.  Lymphadenopathy:    He has no cervical adenopathy.  Neurological: He is alert and oriented to person, place, and time.  Skin: Skin is warm and dry. No rash noted. He is not diaphoretic. No erythema. No pallor.   [facility] reading (PRIMARY) by  Dr. Cleta Albertsaub. Increased lower lobe markings.   Results for orders placed or performed in visit on 05/03/14  POCT CBC  Result Value Ref Range   WBC 7.3 4.6 - 10.2 K/uL   Lymph, poc 2.4 0.6 - 3.4   POC LYMPH PERCENT 32.6 10 - 50 %L   MID (cbc) 0.7 0 - 0.9   POC MID % 9.9 0 - 12 %M   POC Granulocyte 4.2 2 - 6.9   Granulocyte percent 57.5 37 - 80 %G   RBC 5.82 4.69 - 6.13 M/uL   Hemoglobin 17.9 14.1 - 18.1  g/dL   HCT, POC 16.153.4 09.643.5 - 53.7 %   MCV 91.8 80 - 97 fL   MCH, POC 30.7 27 - 31.2 pg   MCHC 33.5 31.8 - 35.4 g/dL   RDW, POC 04.512.6 %   Platelet Count, POC 224.0 142 - 424 K/uL   MPV 8.4 0 - 99.8 fL      Assessment & Plan:  1. Respiratory crackles - DG Chest 2 View; Future - POCT CBC - albuterol (PROVENTIL) (2.5 MG/3ML) 0.083% nebulizer solution 2.5 mg; Take 3 mLs (2.5 mg total) by nebulization once. - ipratropium  (ATROVENT) nebulizer solution 0.5 mg; Take 2.5 mLs (0.5 mg total) by nebulization once.  2. Acute lower respiratory tract infection - albuterol (PROVENTIL HFA;VENTOLIN HFA) 108 (90 BASE) MCG/ACT inhaler; Inhale 2 puffs into the lungs every 4 (four) hours as needed for wheezing or shortness of breath (cough, shortness of breath or wheezing.).  Dispense: 1 Inhaler; Refill: 1 - azithromycin (ZITHROMAX) 250 MG tablet; Take 2 tabs PO x 1 dose, then 1 tab PO QD x 4 days  Dispense: 6 tablet; Refill: 0 - predniSONE (DELTASONE) 20 MG tablet; Take 3 PO QAM x2days, 2 PO QAM x2days, 1 PO QAM x2days  Dispense: 12 tablet; Refill: 0   Mackensi Mahadeo PA-C  [facility] 05/03/2014 2:53 PM

## 2014-07-17 ENCOUNTER — Encounter: Payer: Self-pay | Admitting: Family Medicine

## 2014-07-17 ENCOUNTER — Ambulatory Visit (INDEPENDENT_AMBULATORY_CARE_PROVIDER_SITE_OTHER): Payer: BLUE CROSS/BLUE SHIELD | Admitting: Family Medicine

## 2014-07-17 VITALS — BP 136/78 | HR 71 | Temp 97.8°F | Resp 16 | Ht 70.5 in | Wt 168.0 lb

## 2014-07-17 DIAGNOSIS — R059 Cough, unspecified: Secondary | ICD-10-CM

## 2014-07-17 DIAGNOSIS — R05 Cough: Secondary | ICD-10-CM

## 2014-07-17 NOTE — Patient Instructions (Signed)
Try over the counter Zyrtec or Claritin (generic is fine) every day for 1 month Take Mucinex and really push fluids for the next 5-7 days Try to use albuterol inhaler before bed and try Delsym cough suppressant If you are not considerably better in 2 weeks, lets consider rechecking a chest XRAY. Cough, Adult  A cough is a reflex that helps clear your throat and airways. It can help heal the body or may be a reaction to an irritated airway. A cough may only last 2 or 3 weeks (acute) or may last more than 8 weeks (chronic).  CAUSES Acute cough:  Viral or bacterial infections. Chronic cough:  Infections.  Allergies.  Asthma.  Post-nasal drip.  Smoking.  Heartburn or acid reflux.  Some medicines.  Chronic lung problems (COPD).  Cancer. SYMPTOMS   Cough.  Fever.  Chest pain.  Increased breathing rate.  High-pitched whistling sound when breathing (wheezing).  Colored mucus that you cough up (sputum). TREATMENT   A bacterial cough may be treated with antibiotic medicine.  A viral cough must run its course and will not respond to antibiotics.  Your caregiver may recommend other treatments if you have a chronic cough. HOME CARE INSTRUCTIONS   Only take over-the-counter or prescription medicines for pain, discomfort, or fever as directed by your caregiver. Use cough suppressants only as directed by your caregiver.  Use a cold steam vaporizer or humidifier in your bedroom or home to help loosen secretions.  Sleep in a semi-upright position if your cough is worse at night.  Rest as needed.  Stop smoking if you smoke. SEEK IMMEDIATE MEDICAL CARE IF:   You have pus in your sputum.  Your cough starts to worsen.  You cannot control your cough with suppressants and are losing sleep.  You begin coughing up blood.  You have difficulty breathing.  You develop pain which is getting worse or is uncontrolled with medicine.  You have a fever. MAKE SURE YOU:    Understand these instructions.  Will watch your condition.  Will get help right away if you are not doing well or get worse. Document Released: 11/29/2010 Document Revised: 08/25/2011 Document Reviewed: 11/29/2010 [facility] Patient Information 2015 [employer] [place]. This information is not intended to replace advice given to you by your health care provider. Make sure you discuss any questions you have with your health care provider.

## 2014-07-17 NOTE — Progress Notes (Signed)
   Subjective:    Patient ID: Anthony Watkins, male    DOB: Aug 26, 1975, 39 y.o.   MRN: [id number]  HPI Patient presents today with continued cough. He was seen 11/15 with URI and was given antibiotic and albuterol. He has continued to cough periodically and have nasal drainage and congestion. He is not currently taking any medication or using his albuterol. Does not drink much liquid other than coffee throughout the day.  He notes that they have been constantly working on his house with painting and other work.  He never has any shortness of breath or cough during the day. Is waking up in the middle of the night with a cough, and feeling like something is in his chest that needs to be expectorated. He reports that he feels better this week. He denies reflux symptoms. He otherwise feels fine except for this intermittent, night time cough. He is exercising strenuously without cough, SOB, wheeze.   Past Medical History  Diagnosis Date  . History of kidney stones    Past Surgical History  Procedure Laterality Date  . Adnoids    . Wisdom tooth extraction    . Knee arthroscopy with anterior cruciate ligament (acl) repair Right 07/19/2013    Procedure: RIGHT KNEE ARTHROSCOPY WITH ANTERIOR CRUCIATE LIGAMENT (ACL) REPAIR;  Surgeon: Velna OchsPeter G Dalldorf, MD;  Location: [facility];  Service: Orthopedics;  Laterality: Right;   No family history on file. History  Substance Use Topics  . Smoking status: Never Smoker   . Smokeless tobacco: Never Used  . Alcohol Use: Yes     Comment: occasional   Review of Systems No fever/chills, no SOB, no headache, clear nasal drainage, thin, clear sputum when cough is productive.     Objective:   Physical Exam  Constitutional: He is oriented to person, place, and time. He appears well-developed and well-nourished.  HENT:  Head: Normocephalic and atraumatic.  Right Ear: External ear normal.  Left Ear: External ear normal.  Nose: Nose normal.    Mouth/Throat: Oropharynx is clear and moist. No oropharyngeal exudate.  Eyes: Conjunctivae are normal.  Neck: Normal range of motion. Neck supple.  Cardiovascular: Normal rate, regular rhythm and normal heart sounds.   Pulmonary/Chest: Effort normal and breath sounds normal. No respiratory distress. He has no wheezes. He has no rales. He exhibits no tenderness.  Musculoskeletal: Normal range of motion. He exhibits no edema.  Lymphadenopathy:    He has no cervical adenopathy.  Neurological: He is alert and oriented to person, place, and time.  Skin: Skin is warm and dry.  Psychiatric: He has a normal mood and affect. His behavior is normal. Judgment and thought content normal.  Vitals reviewed.  BP 136/78 mmHg  Pulse 71  Temp(Src) 97.8 F (36.6 C) (Oral)  Resp 16  Ht 5' 10.5" (1.791 m)  Wt 168 lb (76.204 kg)  BMI 23.76 kg/m2  SpO2 97%    Assessment & Plan:  1. Cough - this is very intermittent in nature, suspect related to PND - provided written and verbal instructions- try otc antihistamine for 1 month, increase fluids, try mucinex, delsym, albuterol at bedtime -RTC if not resolved in 2 weeks, sooner if worsening symptoms- fever/chills, SOB, wheeze  Emi Belfasteborah B. Gessner, FNP-BC  [facility], [facility]  07/18/2014 9:55 AM

## 2016-11-26 ENCOUNTER — Ambulatory Visit (INDEPENDENT_AMBULATORY_CARE_PROVIDER_SITE_OTHER): Payer: BLUE CROSS/BLUE SHIELD | Admitting: Family Medicine

## 2016-11-26 VITALS — BP 120/77 | HR 60 | Temp 98.2°F | Resp 16 | Ht 70.5 in | Wt 171.2 lb

## 2016-11-26 DIAGNOSIS — J22 Unspecified acute lower respiratory infection: Secondary | ICD-10-CM | POA: Diagnosis not present

## 2016-11-26 DIAGNOSIS — L237 Allergic contact dermatitis due to plants, except food: Secondary | ICD-10-CM | POA: Diagnosis not present

## 2016-11-26 MED ORDER — PREDNISONE 20 MG PO TABS: ORAL_TABLET | ORAL | 0 refills | Status: DC

## 2016-11-26 MED ORDER — METHYLPREDNISOLONE ACETATE 80 MG/ML IJ SUSP
80.0000 mg | Freq: Once | INTRAMUSCULAR | Status: AC
  Administered 2016-11-26: 80 mg via INTRAMUSCULAR

## 2016-11-26 NOTE — Progress Notes (Signed)
Anthony Watkins is a 41 y.o. male who presents to Primary Care at [facility] today for poison ivy:  1.  Poison ivy:  Patient was working in his yard on Monday. He began have some itching that evening. He is prone to having bad allergic reactions to poison ivy. He has previously required prednisone for his reactions.  Started noticing worsening of his rash yesterday. Started on his left arm and spread to his chest and stomach. Has now spread to his groin and legs. Woke-up this morning and began having some swelling around his left eye. No actual visual changes. He has had itching and rash around the left side of his face. Also on his right lower leg and foot. He is tried Benadryl and calamine lotion with some relief of his symptoms became in today due to the progression and the fact it's now affected his face.  No fevers or chills. No other exposures.  ROS as above.   PMH reviewed. Patient is a nonsmoker.   Past Medical History:  Diagnosis Date  . History of kidney stones    Past Surgical History:  Procedure Laterality Date  . adnoids    . KNEE ARTHROSCOPY WITH ANTERIOR CRUCIATE LIGAMENT (ACL) REPAIR Right 07/19/2013   Procedure: RIGHT KNEE ARTHROSCOPY WITH ANTERIOR CRUCIATE LIGAMENT (ACL) REPAIR;  Surgeon: Velna Ochs, MD;  Location: [facility];  Service: Orthopedics;  Laterality: Right;  . WISDOM TOOTH EXTRACTION      Medications reviewed. Current Outpatient Prescriptions  Medication Sig Dispense Refill  . albuterol (PROVENTIL HFA;VENTOLIN HFA) 108 (90 BASE) MCG/ACT inhaler Inhale 2 puffs into the lungs every 4 (four) hours as needed for wheezing or shortness of breath (cough, shortness of breath or wheezing.). (Patient not taking: Reported on 07/17/2014) 1 Inhaler 1  . azithromycin (ZITHROMAX) 250 MG tablet Take 2 tabs PO x 1 dose, then 1 tab PO QD x 4 days (Patient not taking: Reported on 07/17/2014) 6 tablet 0  . HYDROcodone-acetaminophen (NORCO/VICODIN) 5-325 MG per  tablet Take 1-2 tablets by mouth every 6 (six) hours as needed for moderate pain. (Patient not taking: Reported on 07/17/2014) 50 tablet 0  . predniSONE (DELTASONE) 20 MG tablet Take 3 PO QAM x2days, 2 PO QAM x2days, 1 PO QAM x2days (Patient not taking: Reported on 07/17/2014) 12 tablet 0   No current facility-administered medications for this visit.      Physical Exam:  BP 120/77   Pulse 60   Temp 98.2 F (36.8 C) (Oral)   Resp 16   Ht 5' 10.5" (1.791 m)   Wt 171 lb 3.2 oz (77.7 kg)   SpO2 100%   BMI 24.22 kg/m  Gen:  Alert, cooperative patient who appears stated age in no acute distress.  Vital signs reviewed. HEENT: Swelling around left eyelid. Also with rash or left side of his face. Eye itself is white without any scleral injection. Pupils equal and reactive to light. No extraocular movement pain. Skin:  Multiple erythematous maculopapules patches with some vesicles scattered on left forearm, left arm, scattered around chest and abdomen. None on back. Also on right leg and calf. Also on right foot. On his face as above.  Assessment and Plan:  1.  Poison ivy: - bad reaction to this - treating with steroids.  Steroid shot today as it is surrounding his eye. No actual eye involvement. -Start oral prednisone tomorrow. Treat for 10 days to prevent rebound reaction. -He can take cetirizine to help with the  itching. -Can continue calamine lotion if he feels this is helping. -Follow-up if no improvement next several days. Follow up sooner if any worsening or eye involvement.

## 2016-11-26 NOTE — Patient Instructions (Signed)
It was good to meet you today.  You do have a bad case of poison ivy.  We are doing a shot for you today.  Start taking the oral prednisone tomorrow.  Take 2 pills a day for 5 days, then 1 pill a day for 5 days, and then stop.  You can take Cetirizine (generic for zyrtec) to help with itching during the day.  This won't make you sleepy like Benadryl.  If you notice any worsening around your eye, come back immediately or go to the emergency room.  The steroid should keep this under control.

## 2017-05-06 ENCOUNTER — Emergency Department (HOSPITAL_COMMUNITY): Payer: BLUE CROSS/BLUE SHIELD

## 2017-05-06 ENCOUNTER — Encounter (HOSPITAL_COMMUNITY): Payer: Self-pay | Admitting: Emergency Medicine

## 2017-05-06 ENCOUNTER — Emergency Department (HOSPITAL_COMMUNITY)
Admission: EM | Admit: 2017-05-06 | Discharge: 2017-05-06 | Disposition: A | Discharge status: Home / Self Care | Payer: BLUE CROSS/BLUE SHIELD | Attending: Emergency Medicine | Admitting: Emergency Medicine

## 2017-05-06 DIAGNOSIS — R109 Unspecified abdominal pain: Secondary | ICD-10-CM | POA: Diagnosis not present

## 2017-05-06 DIAGNOSIS — N200 Calculus of kidney: Secondary | ICD-10-CM

## 2017-05-06 DIAGNOSIS — R1111 Vomiting without nausea: Secondary | ICD-10-CM | POA: Diagnosis not present

## 2017-05-06 DIAGNOSIS — R1031 Right lower quadrant pain: Secondary | ICD-10-CM | POA: Diagnosis present

## 2017-05-06 LAB — CBC WITH DIFFERENTIAL/PLATELET
BASOS ABS: 0 10*3/uL (ref 0.0–0.1)
Basophils Relative: 0 %
EOS ABS: 0.1 10*3/uL (ref 0.0–0.7)
EOS PCT: 1 %
HCT: 46 % (ref 39.0–52.0)
HEMOGLOBIN: 16.6 g/dL (ref 13.0–17.0)
Lymphocytes Relative: 12 %
Lymphs Abs: 1.3 10*3/uL (ref 0.7–4.0)
MCH: 31.9 pg (ref 26.0–34.0)
MCHC: 36.1 g/dL — ABNORMAL HIGH (ref 30.0–36.0)
MCV: 88.3 fL (ref 78.0–100.0)
Monocytes Absolute: 0.5 10*3/uL (ref 0.1–1.0)
Monocytes Relative: 4 %
NEUTROS PCT: 83 %
Neutro Abs: 9.2 10*3/uL — ABNORMAL HIGH (ref 1.7–7.7)
PLATELETS: 215 10*3/uL (ref 150–400)
RBC: 5.21 MIL/uL (ref 4.22–5.81)
RDW: 12 % (ref 11.5–15.5)
WBC: 11.1 10*3/uL — AB (ref 4.0–10.5)

## 2017-05-06 LAB — BASIC METABOLIC PANEL
ANION GAP: 9 (ref 5–15)
BUN: 21 mg/dL — AB (ref 6–20)
CHLORIDE: 102 mmol/L (ref 101–111)
CO2: 23 mmol/L (ref 22–32)
CREATININE: 1.11 mg/dL (ref 0.61–1.24)
Calcium: 9 mg/dL (ref 8.9–10.3)
GFR calc Af Amer: >60 mL/min (ref >60)
Glucose, Bld: 150 mg/dL — ABNORMAL HIGH (ref 65–99)
Potassium: 3.6 mmol/L (ref 3.5–5.1)
Sodium: 134 mmol/L — ABNORMAL LOW (ref 135–145)

## 2017-05-06 LAB — URINALYSIS, ROUTINE W REFLEX MICROSCOPIC
Bilirubin Urine: NEGATIVE
Glucose, UA: NEGATIVE mg/dL
KETONES UR: 5 mg/dL — AB
Leukocytes, UA: NEGATIVE
Nitrite: NEGATIVE
Protein, ur: 30 mg/dL — AB
Specific Gravity, Urine: 1.034 — ABNORMAL HIGH (ref 1.005–1.030)
pH: 5 (ref 5.0–8.0)

## 2017-05-06 MED ORDER — FENTANYL CITRATE (PF) 100 MCG/2ML IJ SOLN
50.0000 ug | INTRAMUSCULAR | Status: DC | PRN
  Administered 2017-05-06: 50 ug via NASAL
  Filled 2017-05-06: qty 2

## 2017-05-06 MED ORDER — ONDANSETRON 4 MG PO TBDP: 4.0000 mg | ORAL_TABLET | Freq: Three times a day (TID) | ORAL | 0 refills | Status: AC | PRN

## 2017-05-06 MED ORDER — HYDROMORPHONE HCL 1 MG/ML IJ SOLN
1.0000 mg | Freq: Once | INTRAMUSCULAR | Status: AC
  Administered 2017-05-06: 1 mg via INTRAVENOUS
  Filled 2017-05-06: qty 1

## 2017-05-06 MED ORDER — KETOROLAC TROMETHAMINE 15 MG/ML IJ SOLN
30.0000 mg | Freq: Once | INTRAMUSCULAR | Status: AC
  Administered 2017-05-06: 30 mg via INTRAVENOUS
  Filled 2017-05-06: qty 2

## 2017-05-06 MED ORDER — MORPHINE SULFATE (PF) 4 MG/ML IV SOLN
4.0000 mg | Freq: Once | INTRAVENOUS | Status: AC
Start: 2017-05-06 — End: 2017-05-06
  Administered 2017-05-06: 4 mg via INTRAVENOUS
  Filled 2017-05-06: qty 1

## 2017-05-06 MED ORDER — SODIUM CHLORIDE 0.9 % IV BOLUS (SEPSIS)
1000.0000 mL | Freq: Once | INTRAVENOUS | Status: AC
  Administered 2017-05-06: 1000 mL via INTRAVENOUS

## 2017-05-06 MED ORDER — OXYCODONE-ACETAMINOPHEN 5-325 MG PO TABS: 1.0000 | ORAL_TABLET | Freq: Three times a day (TID) | ORAL | 0 refills | Status: AC | PRN

## 2017-05-06 MED ORDER — ONDANSETRON 4 MG PO TBDP
4.0000 mg | ORAL_TABLET | Freq: Once | ORAL | Status: AC
  Administered 2017-05-06: 4 mg via ORAL
  Filled 2017-05-06: qty 1

## 2017-05-06 NOTE — ED Provider Notes (Signed)
[facility]-EMERGENCY DEPT Provider Note   CSN: [id number] Arrival date & time: 05/06/17  1551     History   Chief Complaint Chief Complaint  Patient presents with  . Flank Pain  . Emesis    HPI Anthony Watkins is a 41 y.o. male with past medical history of kidney stones, presents to ED for evaluation of 2-hour history of intermittent and now constant right flank pain.  He also reports urinary retention for the past 30 minutes.  He states that the pain is sharp and rated at 10/10.  He also reports intermittent nausea.  States this feels similar to his prior kidney stones.  HPI  Past Medical History:  Diagnosis Date  . History of kidney stones     Patient Active Problem List   Diagnosis Date Noted  . Hx of anterior cruciate ligament surgery 05/03/2014    Past Surgical History:  Procedure Laterality Date  . adnoids    . KNEE ARTHROSCOPY WITH ANTERIOR CRUCIATE LIGAMENT (ACL) REPAIR Right 07/19/2013   Procedure: RIGHT KNEE ARTHROSCOPY WITH ANTERIOR CRUCIATE LIGAMENT (ACL) REPAIR;  Surgeon: Velna Ochs, MD;  Location: [facility];  Service: Orthopedics;  Laterality: Right;  . WISDOM TOOTH EXTRACTION         Home Medications    Prior to Admission medications   Medication Sig Start Date End Date Taking? Authorizing Provider  ibuprofen (ADVIL,MOTRIN) 200 MG tablet Take 600 mg by mouth every 6 (six) hours as needed for headache or moderate pain.   Yes [provider]  albuterol (PROVENTIL HFA;VENTOLIN HFA) 108 (90 BASE) MCG/ACT inhaler Inhale 2 puffs into the lungs every 4 (four) hours as needed for wheezing or shortness of breath (cough, shortness of breath or wheezing.). Patient not taking: Reported on 07/17/2014 05/03/14   Brewington, Tishira R, PA-C  azithromycin (ZITHROMAX) 250 MG tablet Take 2 tabs PO x 1 dose, then 1 tab PO QD x 4 days Patient not taking: Reported on 07/17/2014 05/03/14   Brewington, Tishira R, PA-C    HYDROcodone-acetaminophen (NORCO/VICODIN) 5-325 MG per tablet Take 1-2 tablets by mouth every 6 (six) hours as needed for moderate pain. Patient not taking: Reported on 05/06/2017 07/19/13   Lindwood Qua, PA-C  ondansetron (ZOFRAN ODT) 4 MG disintegrating tablet Take 1 tablet (4 mg total) by mouth every 8 (eight) hours as needed for nausea or vomiting. 05/06/17   Trayshawn Durkin, PA-C  oxyCODONE-acetaminophen (PERCOCET/ROXICET) 5-325 MG tablet Take 1 tablet by mouth every 8 (eight) hours as needed for severe pain. 05/06/17   Tylique Aull, PA-C  predniSONE (DELTASONE) 20 MG tablet Take 2 tabs daily x 5 days, then take 1 tab daily x 5 days Patient not taking: Reported on 05/06/2017 11/26/16   Tobey Grim, MD    Family History No family history on file.  Social History Social History   Tobacco Use  . Smoking status: Never Smoker  . Smokeless tobacco: Never Used  Substance Use Topics  . Alcohol use: Yes    Comment: occasional  . Drug use: No     Allergies   Patient has no known allergies.   Review of Systems Review of Systems  Constitutional: Negative for appetite change, chills and fever.  HENT: Negative for ear pain, rhinorrhea, sneezing and sore throat.   Eyes: Negative for photophobia and visual disturbance.  Respiratory: Negative for cough, chest tightness, shortness of breath and wheezing.   Cardiovascular: Negative for chest pain and palpitations.  Gastrointestinal: Positive  for nausea. Negative for abdominal pain, blood in stool, constipation, diarrhea and vomiting.  Genitourinary: Positive for flank pain. Negative for dysuria, hematuria and urgency.  Musculoskeletal: Negative for myalgias.  Skin: Negative for rash.  Neurological: Negative for dizziness, weakness and light-headedness.     Physical Exam Updated Vital Signs BP (!) 152/94   Pulse (!) 55   Temp (!) 97.4 F (36.3 C) (Oral)   Resp 18   Ht 5\' 10"  (1.778 m)   Wt 74.8 kg (165 lb)   SpO2 99%    BMI 23.68 kg/m   Physical Exam  Constitutional: He appears well-developed and well-nourished. No distress.  Nontoxic appearing.  Does appear extremely uncomfortable due to pain.  HENT:  Head: Normocephalic and atraumatic.  Nose: Nose normal.  Eyes: Conjunctivae and EOM are normal. Right eye exhibits no discharge. Left eye exhibits no discharge. No scleral icterus.  Neck: Normal range of motion. Neck supple.  Cardiovascular: Normal rate, regular rhythm, normal heart sounds and intact distal pulses. Exam reveals no gallop and no friction rub.  No murmur heard. Pulmonary/Chest: Effort normal and breath sounds normal. No respiratory distress.  Abdominal: Soft. Bowel sounds are normal. He exhibits no distension. There is tenderness (Right-sided flank and CVA tenderness). There is no guarding.  Musculoskeletal: Normal range of motion. He exhibits no edema.  Neurological: He is alert. He exhibits normal muscle tone. Coordination normal.  Skin: Skin is warm and dry. No rash noted.  Psychiatric: He has a normal mood and affect.  Nursing note and vitals reviewed.    ED Treatments / Results  Labs (all labs ordered are listed, but only abnormal results are displayed) Labs Reviewed  URINALYSIS, ROUTINE W REFLEX MICROSCOPIC - Abnormal; Notable for the following components:      Result Value   Color, Urine AMBER (*)    APPearance HAZY (*)    Specific Gravity, Urine 1.034 (*)    Hgb urine dipstick MODERATE (*)    Ketones, ur 5 (*)    Protein, ur 30 (*)    Bacteria, UA RARE (*)    Squamous Epithelial / LPF 0-5 (*)    All other components within normal limits  BASIC METABOLIC PANEL - Abnormal; Notable for the following components:   Sodium 134 (*)    Glucose, Bld 150 (*)    BUN 21 (*)    All other components within normal limits  CBC WITH DIFFERENTIAL/PLATELET - Abnormal; Notable for the following components:   WBC 11.1 (*)    MCHC 36.1 (*)    Neutro Abs 9.2 (*)    All other components  within normal limits    EKG  EKG Interpretation None       Radiology Ct Renal Stone Study  Result Date: 05/06/2017 CLINICAL DATA:  Acute onset right flank pain today. Nausea and vomiting. Nephrolithiasis. EXAM: CT ABDOMEN AND PELVIS WITHOUT CONTRAST TECHNIQUE: Multidetector CT imaging of the abdomen and pelvis was performed following the standard protocol without IV contrast. COMPARISON:  02/28/2012 FINDINGS: Lower chest: No acute findings. Hepatobiliary:  No mass visualized on this unenhanced exam. Pancreas: No mass or inflammatory process visualized on this unenhanced exam. Spleen:  Within normal limits in size. Adrenals/Urinary tract: Mild right hydroureteronephrosis is seen due to a 3 mm calculus in the distal right ureter. Stomach/Bowel: No evidence of obstruction, inflammatory process, or abnormal fluid collections. Normal appendix visualized. Vascular/Lymphatic: No pathologically enlarged lymph nodes identified. No evidence of abdominal aortic aneurysm. Reproductive:  No mass or other significant  abnormality. Other:  None. Musculoskeletal:  No suspicious bone lesions identified. IMPRESSION: Mild right hydroureteronephrosis due to 3 mm distal right ureteral calculus. Electronically Signed   By: Myles RosenthalJohn  Stahl M.D.   On: 05/06/2017 19:12    Procedures Procedures (including critical care time)  Medications Ordered in ED Medications  ondansetron (ZOFRAN-ODT) disintegrating tablet 4 mg (4 mg Oral Given 05/06/17 1606)  morphine 4 MG/ML injection 4 mg (4 mg Intravenous Given 05/06/17 1643)  sodium chloride 0.9 % bolus 1,000 mL (0 mLs Intravenous Stopped 05/06/17 1931)  HYDROmorphone (DILAUDID) injection 1 mg (1 mg Intravenous Given 05/06/17 1808)  ketorolac (TORADOL) 15 MG/ML injection 30 mg (30 mg Intravenous Given 05/06/17 1929)     Initial Impression / Assessment and Plan / ED Course  I have reviewed the triage vital signs and the nursing notes.  Pertinent labs & imaging results that  were available during my care of the patient were reviewed by me and considered in my medical decision making (see chart for details).     Patient presents to ED for evaluation of right-sided for the past 2 hours.  States the pain is now constant.  Does have a history of kidney stones and states that this feels similar.  Has not seen a urologist in the past.  On physical exam patient has right-sided flank and CVA tenderness present.  Urinalysis with no signs of infection.  Kidney function within normal limits.  CT renal stone study did show a 3 mm distal right ureter calculus.  Patient's pain control here in the ED with pain medication including Toradol.  Will give pain medication, nausea medication and follow-up with urology for further evaluation if symptoms persist.  Patient appears stable for discharge at this time.  Strict return precautions given.  Final Clinical Impressions(s) / ED Diagnoses   Final diagnoses:  Nephrolithiasis    ED Discharge Orders        Ordered    oxyCODONE-acetaminophen (PERCOCET/ROXICET) 5-325 MG tablet  Every 8 hours PRN     05/06/17 2009    ondansetron (ZOFRAN ODT) 4 MG disintegrating tablet  Every 8 hours PRN     05/06/17 2009       Dietrich PatesKhatri, Amandy Chubbuck, PA-C 05/06/17 2018    Alvira MondaySchlossman, Erin, MD 05/07/17 1441

## 2017-05-06 NOTE — ED Triage Notes (Signed)
Patient c/o right flank pain that started today and was intermittent at first but now very severe. Patient states having n/v and urinary retention over past 30 mins.  Reports having PMH kidney stone.

## 2017-05-06 NOTE — Discharge Instructions (Signed)
Please read the attached information regarding your condition. Take Percocet as needed for pain. Take Zofran as needed for nausea. Follow-up with urologist listed below for further evaluation if symptoms do not improve in 5-7 days. Return to ED for worsening of pain, burning with urination, fever, severe abdominal pain or vomiting.

## 2017-05-06 NOTE — ED Notes (Signed)
PA aware patient is still in pain. Now an 8/10 as medication has worn off again. Patient back from rental study.

## 2017-05-06 NOTE — ED Notes (Signed)
Pt is not able to urinate. Is having difficulties. Tried to provide sample and did not provide enough.

## 2017-05-06 NOTE — ED Notes (Signed)
PA at bedside.

## 2018-01-27 DIAGNOSIS — F4325 Adjustment disorder with mixed disturbance of emotions and conduct: Secondary | ICD-10-CM | POA: Diagnosis not present

## 2018-02-10 DIAGNOSIS — F4325 Adjustment disorder with mixed disturbance of emotions and conduct: Secondary | ICD-10-CM | POA: Diagnosis not present

## 2018-02-24 DIAGNOSIS — F4325 Adjustment disorder with mixed disturbance of emotions and conduct: Secondary | ICD-10-CM | POA: Diagnosis not present

## 2018-04-23 IMAGING — CT CT RENAL STONE PROTOCOL
2 of 3 series · 17 of 46 positions shown, 19 images · non-contrast
Comparison: 02/28/2012

CLINICAL DATA: Acute onset right flank pain today. Nausea and
vomiting. Nephrolithiasis.

EXAM:
CT ABDOMEN AND PELVIS WITHOUT CONTRAST
TECHNIQUE: Multidetector CT imaging of the abdomen and pelvis was performed
following the standard protocol without IV contrast.

[Series 4: lung · axial · 0.74mm/px · z∈[-62,+14]mm · 14 of 44 slices shown, 16 images]
[im 3/44  soft-tissue]
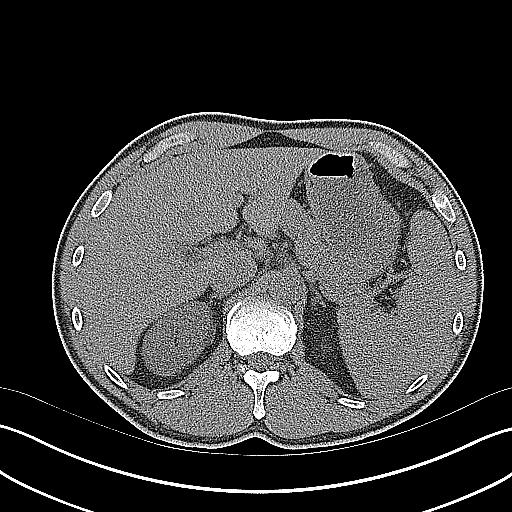
[im 3/44  bone]
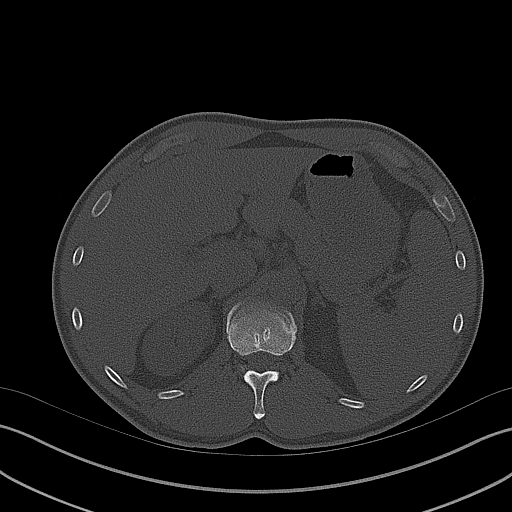
[im 6/44  soft-tissue]
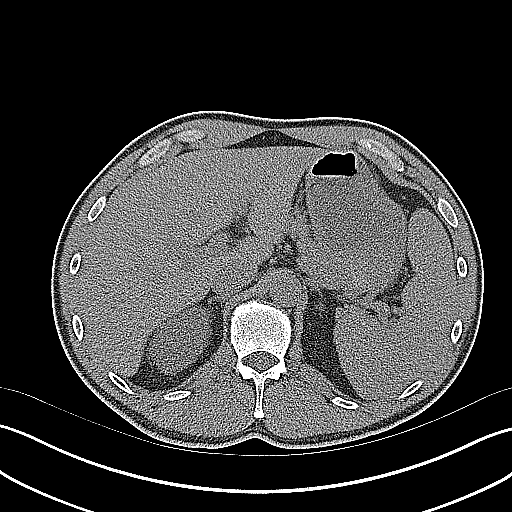
[im 9/44  soft-tissue]
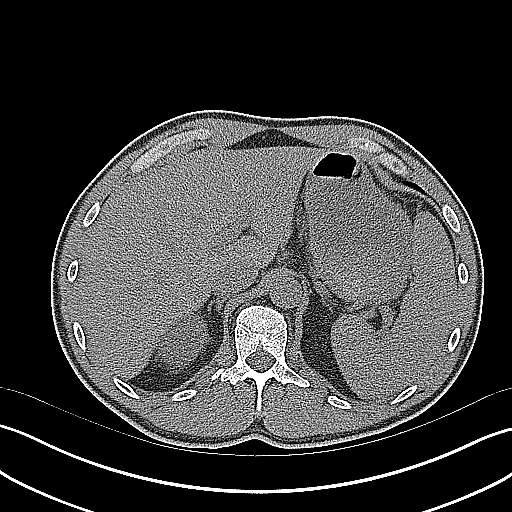
[im 12/44  soft-tissue]
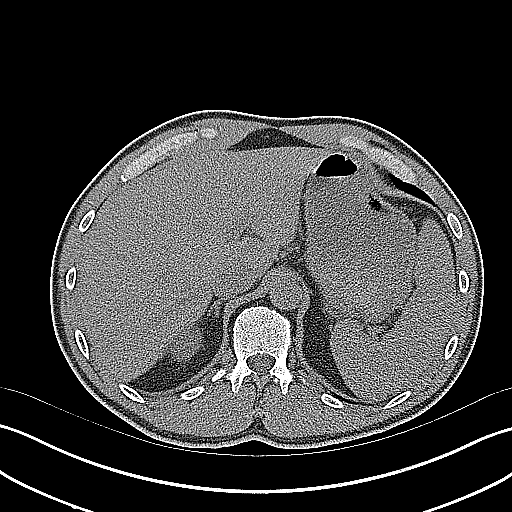
[im 14/44  soft-tissue]
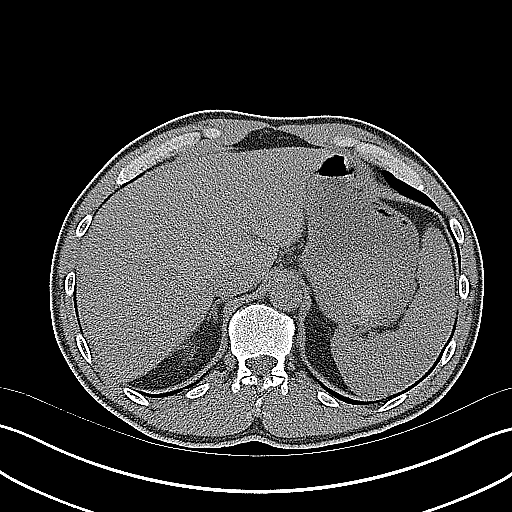
[im 17/44  soft-tissue]
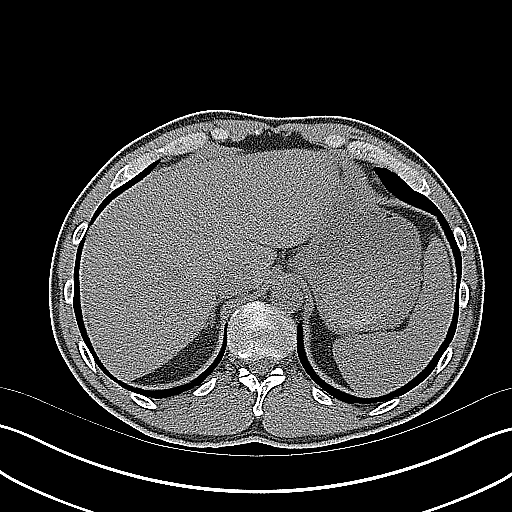
[im 20/44  soft-tissue]
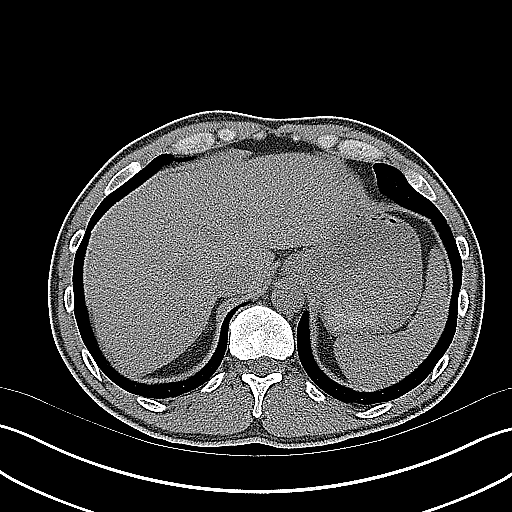
[im 24/44  soft-tissue]
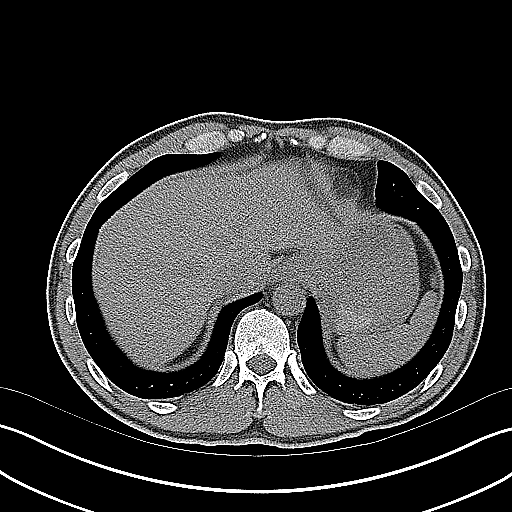
[im 27/44  soft-tissue]
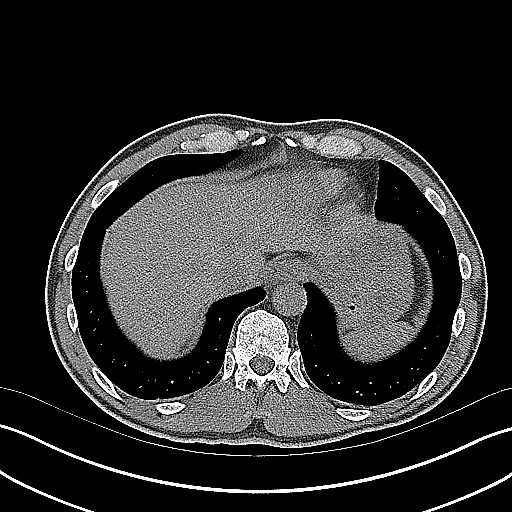
[im 27/44  bone]
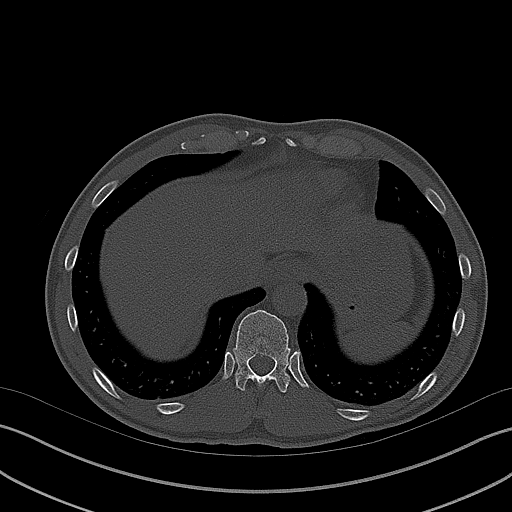
[im 30/44  soft-tissue]
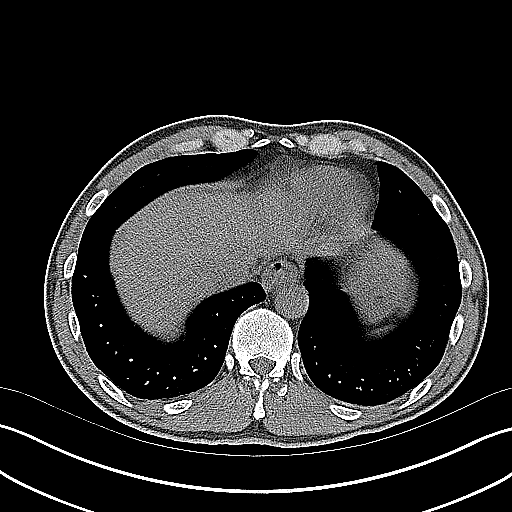
[im 32/44  soft-tissue]
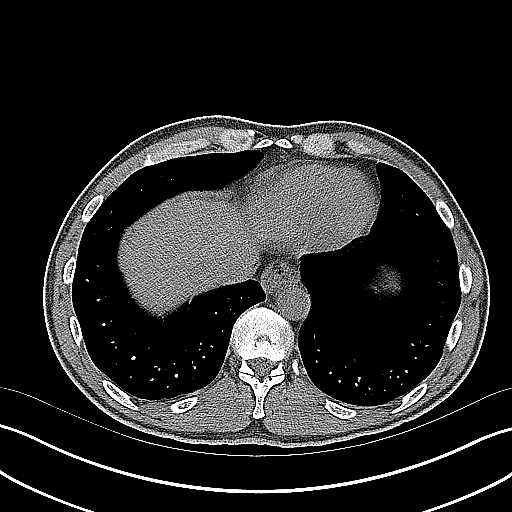
[im 35/44  soft-tissue]
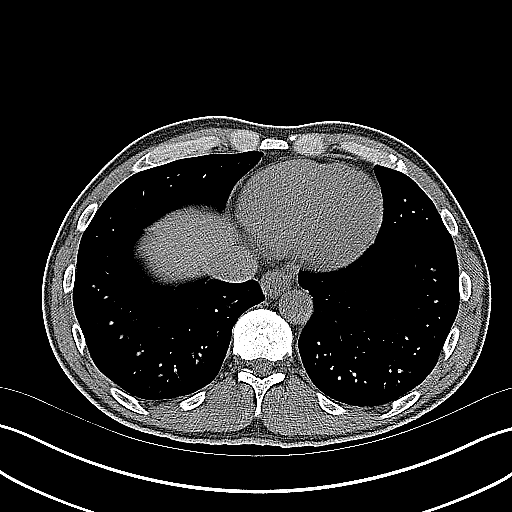
[im 38/44  soft-tissue]
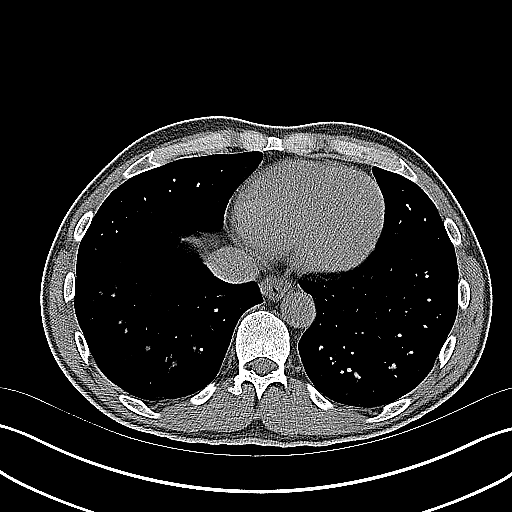
[im 41/44  soft-tissue]
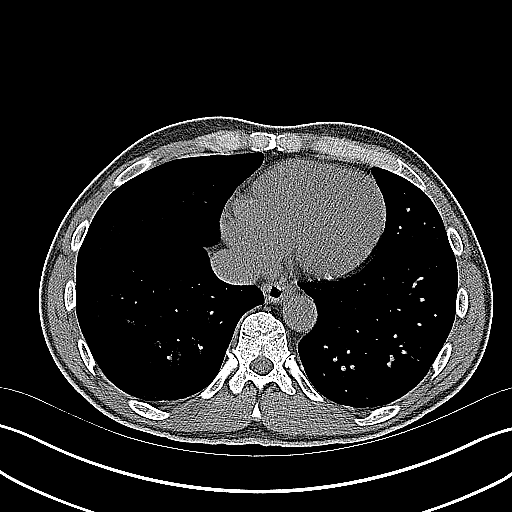

[Series 5: coronal · coronal · 0.76mm/px · 3 of 113 slices shown]
[im 38/113  soft-tissue]
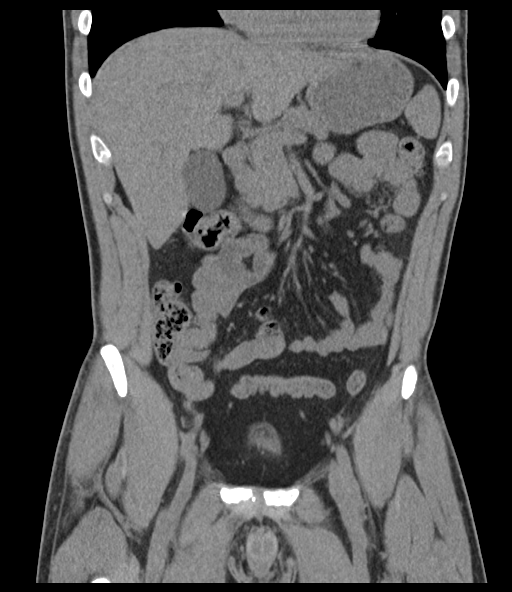
[im 50/113  soft-tissue]
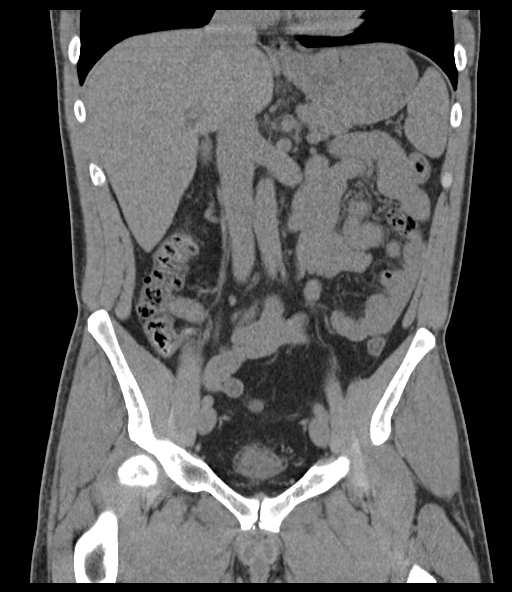
[im 63/113  soft-tissue]
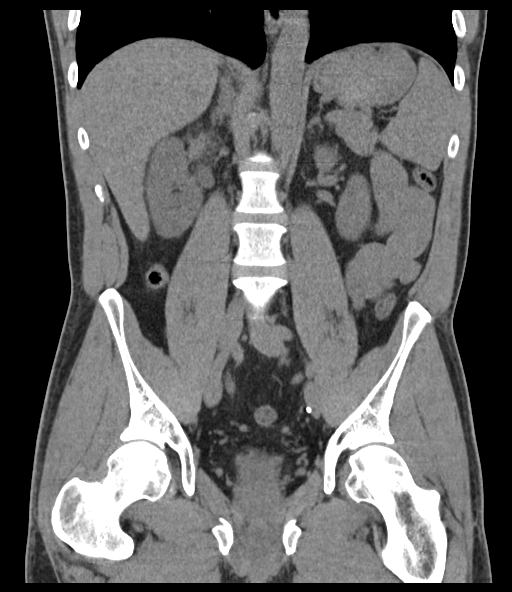

[17 of 46 positions shown; findings below may reference images not displayed]

FINDINGS: Lower chest: No acute findings.

Hepatobiliary:  No mass visualized on this unenhanced exam.

Pancreas: No mass or inflammatory process visualized on this
unenhanced exam.

Spleen:  Within normal limits in size.

Adrenals/Urinary tract: Mild right hydroureteronephrosis is seen due
to a 3 mm calculus in the distal right ureter.

Stomach/Bowel: No evidence of obstruction, inflammatory process, or
abnormal fluid collections. Normal appendix visualized.

Vascular/Lymphatic: No pathologically enlarged lymph nodes
identified. No evidence of abdominal aortic aneurysm.

Reproductive:  No mass or other significant abnormality.

Other:  None.

Musculoskeletal:  No suspicious bone lesions identified.
IMPRESSION: Mild right hydroureteronephrosis due to 3 mm distal right ureteral
calculus.

## 2018-04-28 ENCOUNTER — Other Ambulatory Visit: Payer: Self-pay

## 2018-04-28 ENCOUNTER — Ambulatory Visit: Payer: BLUE CROSS/BLUE SHIELD | Admitting: Physician Assistant

## 2018-04-28 ENCOUNTER — Encounter: Payer: Self-pay | Admitting: Physician Assistant

## 2018-04-28 VITALS — BP 123/78 | HR 63 | Temp 97.9°F | Resp 20 | Ht 70.95 in | Wt 170.4 lb

## 2018-04-28 DIAGNOSIS — M5412 Radiculopathy, cervical region: Secondary | ICD-10-CM | POA: Diagnosis not present

## 2018-04-28 DIAGNOSIS — M62838 Other muscle spasm: Secondary | ICD-10-CM | POA: Diagnosis not present

## 2018-04-28 DIAGNOSIS — L989 Disorder of the skin and subcutaneous tissue, unspecified: Secondary | ICD-10-CM

## 2018-04-28 MED ORDER — CYCLOBENZAPRINE HCL 5 MG PO TABS: 5.0000 mg | ORAL_TABLET | Freq: Three times a day (TID) | ORAL | 0 refills | Status: AC | PRN

## 2018-04-28 MED ORDER — PREDNISONE 20 MG PO TABS: ORAL_TABLET | ORAL | 0 refills | Status: AC

## 2018-04-28 NOTE — Patient Instructions (Addendum)
We are going to treat your underlying inflammation with oral prednisone. Prednisone is a steroid and can cause side effects such as headache, irritability, nausea, vomiting, increased heart rate, increased blood pressure, increased blood sugar, appetite changes, and insomnia. Please take tablets in the morning with a full meal to help decrease the chances of these side effects.    I also suggest muscle relaxer, this can make you drowsy.  I have placed a referral to physical therapy as you may warrant cupping or dry needling to the affected area. Massage will likely give you benefit as well.   Return to clinic if symptoms worsen, do not improve, or as needed.      If you have lab work done today you will be contacted with your lab results within the next 2 weeks.  If you have not heard from [place] then please contact us. The fastest way to get your results is to register for My Chart.   Cervical Radiculopathy Cervical radiculopathy means that a nerve in the neck is pinched or bruised. This can cause pain or loss of feeling (numbness) that runs from your neck to your arm and fingers. Follow these instructions at home: Managing pain  Take over-the-counter and prescription medicines only as told by your doctor.  If directed, put ice on the injured or painful area. ? Put ice in a plastic bag. ? Place a towel between your skin and the bag. ? Leave the ice on for 20 minutes, 2-3 times per day.  If ice does not help, you can try using heat. Take a warm shower or warm bath, or use a heat pack as told by your doctor.  You may try a gentle neck and shoulder massage. Activity  Rest as needed. Follow instructions from your doctor about any activities to avoid.  Do exercises as told by your doctor or physical therapist. General instructions  If you were given a soft collar, wear it as told by your doctor.  Use a flat pillow when you sleep.  Keep all follow-up visits as told by your doctor. This  is important. Contact a doctor if:  Your condition does not improve with treatment. Get help right away if:  Your pain gets worse and is not controlled with medicine.  You lose feeling or feel weak in your hand, arm, face, or leg.  You have a fever.  You have a stiff neck.  You cannot control when you poop or pee (have incontinence).  You have trouble with walking, balance, or talking. This information is not intended to replace advice given to you by your health care provider. Make sure you discuss any questions you have with your health care provider. Document Released: 05/22/2011 Document Revised: 11/08/2015 Document Reviewed: 07/27/2014 Elsevier Interactive Patient Education  2018 [employer]  Muscle Cramps and Spasms Muscle cramps and spasms are when muscles tighten by themselves. They usually get better within minutes. Muscle cramps are painful. They are usually stronger and last longer than muscle spasms. Muscle spasms may or may not be painful. They can last a few seconds or much longer. Follow these instructions at home:  Drink enough fluid to keep your pee (urine) clear or pale yellow.  Massage, stretch, and relax the muscle.  If directed, apply heat to tight or tense muscles as often as told by your doctor. Use the heat source that your doctor recommends. ? Place a towel between your skin and the heat source. ? Leave the heat on for  20-30 minutes. ? Take off the heat if your skin turns bright red. This is especially important if you are unable to feel pain, heat, or cold. You may have a greater risk of getting burned.  If directed, put ice on the affected area. This may help if you are sore or have pain after a cramp or spasm. ? Put ice in a plastic bag. ? Place a towel between your skin and the bag. ? Leave the ice on for 20 minutes, 2-3 times a day.  Take over-the-counter and prescription medicines only as told by your doctor.  Pay attention to any changes in  your symptoms. Contact a doctor if:  Your cramps or spasms get worse or happen more often.  Your cramps or spasms do not get better with time. This information is not intended to replace advice given to you by your health care provider. Make sure you discuss any questions you have with your health care provider. Document Released: 05/15/2008 Document Revised: 07/04/2015 Document Reviewed: 03/06/2015 Elsevier Interactive Patient Education  2018 [employer]  IF you received an x-ray today, you will receive an invoice from [facility]. Please contact [facility] at [phone] with questions or concerns regarding your invoice.   IF you received labwork today, you will receive an invoice from [facility]. Please contact [facility] at [phone] with questions or concerns regarding your invoice.   Our billing staff will not be able to assist you with questions regarding bills from these companies.  You will be contacted with the lab results as soon as they are available. The fastest way to get your results is to activate your My Chart account. Instructions are located on the last page of this paperwork. If you have not heard from us regarding the results in 2 weeks, please contact this office.

## 2018-04-28 NOTE — Progress Notes (Signed)
Anthony Watkins  MRN: [id number] DOB: 1975-09-17  Subjective:   Anthony Watkins is a 42 y.o. male who presents with left shoulder pain. The symptoms began several months ago. Aggravating factors: no known event. Pain is located posterior shoulder and neck. Discomfort is described as tight, spasm like. Symptoms are exacerbated by repetitive movements. Sometimes pain is so uncomfortable in his posterior shoulder it feels like it is causing nerve pain down the arm. This wil be improved with lifting arm overhead. Evaluation to date: none. Therapy to date includes: rest and OTC analgesics which are not very effective.  Has taken ibuprofen consistently without any relief.  Denies fever, chills, night sweats, overlying skin changes, numbness, tingling, loss of range of motion, weakness, and dropping objects.  No past medical history of hypertension or diabetes.  Would also like referral to dermatologist for lesion on top of right ear.  Has had this for a while.  Denies pain, bleeding, redness, warmth, purulent drainage.  Has not tried anything for relief.  Does not wear sunscreen.  Review of Systems  Per HPI  Patient Active Problem List   Diagnosis Date Noted  . Hx of anterior cruciate ligament surgery 05/03/2014    Current Outpatient Medications on File Prior to Visit  Medication Sig Dispense Refill  . ibuprofen (ADVIL,MOTRIN) 200 MG tablet Take 600 mg by mouth every 6 (six) hours as needed for headache or moderate pain.    Marland Kitchen albuterol (PROVENTIL HFA;VENTOLIN HFA) 108 (90 BASE) MCG/ACT inhaler Inhale 2 puffs into the lungs every 4 (four) hours as needed for wheezing or shortness of breath (cough, shortness of breath or wheezing.). (Patient not taking: Reported on 07/17/2014) 1 Inhaler 1  . azithromycin (ZITHROMAX) 250 MG tablet Take 2 tabs PO x 1 dose, then 1 tab PO QD x 4 days (Patient not taking: Reported on 07/17/2014) 6 tablet 0  . HYDROcodone-acetaminophen (NORCO/VICODIN) 5-325 MG per tablet  Take 1-2 tablets by mouth every 6 (six) hours as needed for moderate pain. (Patient not taking: Reported on 05/06/2017) 50 tablet 0  . ondansetron (ZOFRAN ODT) 4 MG disintegrating tablet Take 1 tablet (4 mg total) by mouth every 8 (eight) hours as needed for nausea or vomiting. (Patient not taking: Reported on 04/28/2018) 20 tablet 0  . oxyCODONE-acetaminophen (PERCOCET/ROXICET) 5-325 MG tablet Take 1 tablet by mouth every 8 (eight) hours as needed for severe pain. (Patient not taking: Reported on 04/28/2018) 12 tablet 0  . predniSONE (DELTASONE) 20 MG tablet Take 2 tabs daily x 5 days, then take 1 tab daily x 5 days (Patient not taking: Reported on 05/06/2017) 15 tablet 0   No current facility-administered medications on file prior to visit.     No Known Allergies    Social History   Socioeconomic History  . Marital status: Married    Spouse name: Not on file  . Number of children: Not on file  . Years of education: Not on file  . Highest education level: Not on file  Occupational History  . Not on file  Social Needs  . Financial resource strain: Not on file  . Food insecurity:    Worry: Not on file    Inability: Not on file  . Transportation needs:    Medical: Not on file    Non-medical: Not on file  Tobacco Use  . Smoking status: Never Smoker  . Smokeless tobacco: Never Used  Substance and Sexual Activity  . Alcohol use: Yes    Comment:  occasional  . Drug use: No    Types: Marijuana  . Sexual activity: Yes  Lifestyle  . Physical activity:    Days per week: Not on file    Minutes per session: Not on file  . Stress: Not on file  Relationships  . Social connections:    Talks on phone: Not on file    Gets together: Not on file    Attends religious service: Not on file    Active member of club or organization: Not on file    Attends meetings of clubs or organizations: Not on file    Relationship status: Not on file  . Intimate partner violence:    Fear of current or  ex partner: Not on file    Emotionally abused: Not on file    Physically abused: Not on file    Forced sexual activity: Not on file  Other Topics Concern  . Not on file  Social History Narrative  . Not on file    Objective:  BP 123/78   Pulse 63   Temp 97.9 F (36.6 C) (Oral)   Resp 20   Ht 5' 10.95" (1.802 m)   Wt 170 lb 6.4 oz (77.3 kg)   SpO2 99%   BMI 23.80 kg/m   Physical Exam  Constitutional: He is oriented to person, place, and time. He appears well-developed and well-nourished. No distress.  HENT:  Head: Normocephalic and atraumatic.  Eyes: Conjunctivae are normal.  Neck: Normal range of motion and full passive range of motion without pain. Muscular tenderness present. No spinous process tenderness present. No neck rigidity. No edema, no erythema and normal range of motion present.  Cardiovascular:  Pulses:      Radial pulses are 2+ on the right side, and 2+ on the left side.  Pulmonary/Chest: Effort normal.  Musculoskeletal:       Right shoulder: He exhibits normal range of motion, no bony tenderness and normal strength.       Left shoulder: He exhibits normal range of motion, no bony tenderness and normal strength.       Cervical back: He exhibits spasm (large palpable spasms in bilateral trapezius musculature, pt reports improvement in discomfort with deep palpation). He exhibits normal range of motion, no bony tenderness and no swelling.  Neurological: He is alert and oriented to person, place, and time.  Reflex Scores:      Tricep reflexes are 2+ on the right side and 2+ on the left side.      Bicep reflexes are 2+ on the right side and 2+ on the left side.      Brachioradialis reflexes are 2+ on the right side and 2+ on the left side. Sensation of BUE intact Neg Spurling's maneuver    Skin: Skin is warm and dry.     Psychiatric: He has a normal mood and affect.  Vitals reviewed.   Assessment and Plan :  1. Muscle spasm of left shoulder Large palpable  spasms noted on exam.  Neuro exam normal.  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  Negative Spurling's maneuver on exam.  He has not had any improvement with over-the-counter ibuprofen.  He did report improvement in discomfort today when deep pressure was applied to affected area.  Believe he would highly benefit from physical therapy as he may warrant cupping or dry needling to the affected area, massage would likely be of some benefit as well.  Given Rx for Muscle relaxant and will attempt  a trial of prednisone.  Rec heating to affected areas. Advised to return to clinic if symptoms worsen, do not improve, or as needed.  - cyclobenzaprine (FLEXERIL) 5 MG tablet; Take 1 tablet (5 mg total) by mouth 3 (three) times daily as needed for muscle spasms.  Dispense: 60 tablet; Refill: 0 - Ambulatory referral to Physical Therapy - Care order/instruction:  2. Cervical radiculitis - predniSONE (DELTASONE) 20 MG tablet; Take 3 PO QAM x3days, 2 PO QAM x3days, 1 PO QAM x3days  Dispense: 18 tablet; Refill: 0  3. Skin lesion Lesion warrants biopsy. Pt prefers referral to derm for this. Referral placed.  - Ambulatory referral to Dermatology   Benjiman Core PA-C  [facility] at [facility] 04/28/2018 9:39 AM

## 2018-04-29 ENCOUNTER — Encounter: Payer: Self-pay | Admitting: Physician Assistant

## 2018-05-12 ENCOUNTER — Ambulatory Visit: Payer: BLUE CROSS/BLUE SHIELD | Attending: Physician Assistant | Admitting: Physical Therapy

## 2018-05-12 DIAGNOSIS — M62838 Other muscle spasm: Secondary | ICD-10-CM | POA: Diagnosis not present

## 2018-05-12 DIAGNOSIS — R293 Abnormal posture: Secondary | ICD-10-CM | POA: Insufficient documentation

## 2018-05-12 NOTE — Therapy (Signed)
[facility] [address] [place], [place], [address] Phone: [phone]   Fax:  [phone]  Physical Therapy Evaluation  Patient Details  Name: Anthony Watkins MRN: [id number] Date of Birth: [birth year] Referring Provider (PT): Benjiman Core PA-C   Encounter Date: 05/12/2018  PT End of Session - 05/12/18 1059    Visit Number  1    Number of Visits  7    Date for PT Re-Evaluation  06/23/18    PT Start Time  1015    PT Stop Time  1059    PT Time Calculation (min)  44 min    Activity Tolerance  Patient tolerated treatment well    Behavior During Therapy  [facility] for tasks assessed/performed       Past Medical History:  Diagnosis Date  . History of kidney stones     Past Surgical History:  Procedure Laterality Date  . adnoids    . KNEE ARTHROSCOPY WITH ANTERIOR CRUCIATE LIGAMENT (ACL) REPAIR Right 07/19/2013   Procedure: RIGHT KNEE ARTHROSCOPY WITH ANTERIOR CRUCIATE LIGAMENT (ACL) REPAIR;  Surgeon: Velna Ochs, MD;  Location: [facility];  Service: Orthopedics;  Laterality: Right;  . WISDOM TOOTH EXTRACTION      There were no vitals filed for this visit.   Subjective Assessment - 05/12/18 1022    Subjective  pt is a 42 y.o m with CC of L shoulder that started about 3-4 weeks ago with no specific onset just woke up one morning and it felt like a crick in the neck. pt rpeorts no improvmenet of pain inthe shoulder and reports radiating pain in the L elbow/ wrist. pt denies any L shoulder prior to this occuring. pt denies any HA since this began.Marland Kitchen     How long can you sit comfortably?  unlimite    How long can you stand comfortably?  unlimited    How long can you walk comfortably?  unlimited    Diagnostic tests  N/A    Patient Stated Goals  to be pain free, return back to normal     Currently in Pain?  Yes    Pain Score  3    at worst 9/10   Pain Location  Shoulder    Pain Orientation  Right    Pain  Descriptors / Indicators  Throbbing;Aching;Sore    Pain Type  Chronic pain    Pain Radiating Towards  referred to the L elbow/ wrist    Pain Onset  More than a month ago    Pain Frequency  Intermittent    Aggravating Factors   laying down at night, not moving     Pain Relieving Factors  flexiril, ibuprofen, heating pad, ice,          [facility] PT Assessment - 05/12/18 1024      Assessment   Medical Diagnosis  L shoulder muscle spasm    Referring Provider (PT)  Benjiman Core PA-C    Onset Date/Surgical Date  --   3-4 weeks ago   Hand Dominance  Right    Next MD Visit  make one PRN    Prior Therapy  yes    ACL 42 years ago     Precautions   Precautions  None      Restrictions   Weight Bearing Restrictions  No      Balance Screen   Has the patient fallen in the past 6 months  No    Has the patient  had a decrease in activity level because of a fear of falling?   No    Is the patient reluctant to leave their home because of a fear of falling?   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   Vocation Requirements  lifting, carrying, pushing, pulling      Cognition   Overall Cognitive Status  Within Functional Limits for tasks assessed      Observation/Other Assessments   Focus on Therapeutic Outcomes (FOTO)   39% limited   predicted 23% limited     Posture/Postural Control   Posture/Postural Control  Postural limitations    Postural Limitations  Rounded Shoulders;Forward head      ROM / Strength   AROM / PROM / Strength  AROM;Strength      AROM   Overall AROM   Within  functional limits for tasks performed    AROM Assessment Site  Shoulder      Strength   Overall Strength  Within functional limits for tasks performed    Overall Strength Comments  4+/5 on the L but no pain     Strength Assessment Site  Shoulder    Right/Left Shoulder  Right;Left      Palpation   Palpation comment   TTP note along L upper trap/ levator scapulae with multiple trigger points.                 Objective measurements completed on examination: See above findings.      [facility] Adult PT Treatment/Exercise - 05/12/18 1024      Exercises   Exercises  Shoulder      Shoulder Exercises: Stretch   Other Shoulder Stretches  rhomboid, upper trap and levator scapulae stretch 2 x 30 sec L only      Manual Therapy   Manual Therapy  Soft tissue mobilization    Manual therapy comments  skilled palpation and monitoring of pt throughout TPDN    Soft tissue mobilization  IASTM along L upper trap/ levator scapulae       Trigger Point Dry Needling - 05/12/18 1101    Consent Given?  Yes    Education Handout Provided  Yes    Muscles Treated Upper Body  Upper trapezius;Levator scapulae    Upper Trapezius Response  Twitch reponse elicited;Palpable increased muscle length   l only   Levator Scapulae Response  Twitch response elicited;Palpable increased muscle length   L only          PT Education - 05/12/18 1059    Education Details  evaluation findings, POC, goals, HEP with proper form/ rationale. muscle anatomy and referral patterns, wath TPDN is, benefits and after care.     Person(s) Educated  Patient    Methods  Explanation;Demonstration;Verbal cues    Comprehension  Verbalized understanding;Returned demonstration;Verbal cues required       PT Short Term Goals - 05/12/18 1107  PT SHORT TERM GOAL #1   Title  pt to be I with inital HEP     Time  3    Period  Weeks    Status  New    Target Date  06/02/18        PT Long Term Goals - 05/12/18 1108       PT LONG TERM GOAL #1   Title  report decreased pain and stiffness in the l upper trap and surrounding musculature to </= 2/10 pain     Time  6    Period  Weeks    Status  New    Target Date  06/23/18      PT LONG TERM GOAL #2   Title  pt to report sleeping throughout the night with </= 1/10 pain     Time  6    Period  Weeks    Status  New    Target Date  06/23/18      PT LONG TERM GOAL #3   Title  pt to be able to lift, carrying, push and pull as it related to work with no report of pain or stiffness     Time  6    Period  Weeks    Status  New    Target Date  06/23/18      PT LONG TERM GOAL #4   Title  increase FOTO score to </= 23% limited to demo improvement in function    Time  6    Period  Weeks    Status  New    Target Date  06/23/18      PT LONG TERM GOAL #5   Title  pt to be I with all HEP given as of last visit to maintain and progress current level of function    Time  6    Period  Weeks    Target Date  06/23/18             Plan - 05/12/18 1100    Clinical Impression Statement  pt is a 42 y.o m presenting to OPPT with CC of L shoulder pain/ muscle spasm starting 3-4 weeks ago with no specific onset. pt demonstrats functional shoulder ROM and strength. TTP note along L upper trap/ levator scapulae with multiple trigger points. edcuated and conset was given for TPDN along upper trap and levator scapulae followed with IASTM and stretching. He would benefit from physical therapy to decrease L shoulder pain, improve posture and maximize and function by addressing the deficits listed.     Clinical Presentation  Stable    Clinical Decision Making  Low    Rehab Potential  Excellent    PT Frequency  1x / week    PT Duration  6 weeks    PT Treatment/Interventions  ADLs/Self Care Home Management;Cryotherapy;Electrical Stimulation;Iontophoresis 4mg /ml Dexamethasone;Moist Heat;Ultrasound;Neuromuscular re-education;Therapeutic activities;Therapeutic exercise;Manual  techniques;Taping;Dry needling;Passive range of motion;Patient/family education    PT Next Visit Plan  review/ update HEP, how was DN, STW, shoulder stretching, posture education     PT Home Exercise Plan  upper trap/ levator scapulae, rhomboid stretching.     Consulted and Agree with Plan of Care  Patient       Patient will benefit from skilled therapeutic intervention in order to improve the following deficits and impairments:  Pain, Increased fascial restricitons, Improper body mechanics, Postural dysfunction, Decreased endurance, Decreased activity tolerance, Increased muscle spasms  Visit Diagnosis: Other muscle spasm  Abnormal posture  Problem List Patient Active Problem List   Diagnosis Date Noted  . Hx of anterior cruciate ligament surgery 05/03/2014   Anthony Watkins PT, DPT, LAT, ATC  05/12/18  11:11 AM      [facility] [address] [place], [place], [address] Phone: [phone]   Fax:  [phone]  Name: Anthony Watkins MRN: [id number] Date of Birth: September 12, 1975

## 2018-05-21 ENCOUNTER — Other Ambulatory Visit: Payer: Self-pay

## 2018-05-21 ENCOUNTER — Ambulatory Visit: Payer: BLUE CROSS/BLUE SHIELD | Admitting: Emergency Medicine

## 2018-05-21 ENCOUNTER — Encounter: Payer: Self-pay | Admitting: Emergency Medicine

## 2018-05-21 VITALS — BP 120/83 | HR 89 | Temp 97.9°F | Resp 16 | Ht 70.0 in | Wt 170.8 lb

## 2018-05-21 DIAGNOSIS — M62838 Other muscle spasm: Secondary | ICD-10-CM

## 2018-05-21 DIAGNOSIS — S46812S Strain of other muscles, fascia and tendons at shoulder and upper arm level, left arm, sequela: Secondary | ICD-10-CM

## 2018-05-21 DIAGNOSIS — M7918 Myalgia, other site: Secondary | ICD-10-CM

## 2018-05-21 MED ORDER — HYDROCODONE-ACETAMINOPHEN 5-325 MG PO TABS: 1.0000 | ORAL_TABLET | Freq: Every evening | ORAL | 0 refills | Status: AC | PRN

## 2018-05-21 MED ORDER — CYCLOBENZAPRINE HCL 10 MG PO TABS: 10.0000 mg | ORAL_TABLET | Freq: Three times a day (TID) | ORAL | 0 refills | Status: AC | PRN

## 2018-05-21 NOTE — Progress Notes (Signed)
Anthony Watkins 42 y.o.   Chief Complaint  Patient presents with  . Shoulder Pain    was seen by Anthony Watkins on 05/12/2018  FOLLOW UP and unable to sleep  because of pain left pain down arm -PER PATIENT was sent to PT    HISTORY OF PRESENT ILLNESS: This is a 42 y.o. male complaining of left upper back pain for the past 1 to 2 months.  Seen here on 04/28/2018 by Anthony Watkins.  Started on Flexeril and prednisone.  Referred to physical therapy.  Patient has also seen a chiropractor.  Still experiencing pain, mostly at night when pain becomes an 8 out of 10 scale.  HPI   Prior to Admission medications   Medication Sig Start Date End Date Taking? Authorizing Provider  cyclobenzaprine (FLEXERIL) 5 MG tablet Take 1 tablet (5 mg total) by mouth 3 (three) times daily as needed for muscle spasms. 04/28/18  Yes Anthony Watkins, [place] Watkins, Anthony Watkins  ibuprofen (ADVIL,MOTRIN) 200 MG tablet Take 600 mg by mouth every 6 (six) hours as needed for headache or moderate pain.   Yes Provider, Historical, Watkins  albuterol (PROVENTIL HFA;VENTOLIN HFA) 108 (90 BASE) MCG/ACT inhaler Inhale 2 puffs into the lungs every 4 (four) hours as needed for wheezing or shortness of breath (cough, shortness of breath or wheezing.). Patient not taking: Reported on 05/21/2018 05/03/14   [place], Anthony Watkins, Anthony Watkins  HYDROcodone-acetaminophen (NORCO/VICODIN) 5-325 MG per tablet Take 1-2 tablets by mouth every 6 (six) hours as needed for moderate pain. Patient not taking: Reported on 05/21/2018 07/19/13   Anthony Watkins  ondansetron (ZOFRAN ODT) 4 MG disintegrating tablet Take 1 tablet (4 mg total) by mouth every 8 (eight) hours as needed for nausea or vomiting. Patient not taking: Reported on 05/21/2018 05/06/17   Anthony Watkins  oxyCODONE-acetaminophen (PERCOCET/ROXICET) 5-325 MG tablet Take 1 tablet by mouth every 8 (eight) hours as needed for severe pain. Patient not taking: Reported on 05/21/2018 05/06/17   Anthony Watkins    predniSONE (DELTASONE) 20 MG tablet Take 3 PO QAM x3days, 2 PO QAM x3days, 1 PO QAM x3days Patient not taking: Reported on 05/21/2018 04/28/18   Anthony Watkins    No Known Allergies  Patient Active Problem List   Diagnosis Date Noted  . Hx of anterior cruciate ligament surgery 05/03/2014    Past Medical History:  Diagnosis Date  . History of kidney stones     Past Surgical History:  Procedure Laterality Date  . adnoids    . KNEE ARTHROSCOPY WITH ANTERIOR CRUCIATE LIGAMENT (ACL) REPAIR Right 07/19/2013   Procedure: RIGHT KNEE ARTHROSCOPY WITH ANTERIOR CRUCIATE LIGAMENT (ACL) REPAIR;  Surgeon: Anthony Watkins;  Location: [facility];  Service: Orthopedics;  Laterality: Right;  . WISDOM TOOTH EXTRACTION      Social History   Socioeconomic History  . Marital status: Married    Spouse name: Not on file  . Number of children: Not on file  . Years of education: Not on file  . Highest education level: Not on file  Occupational History  . Not on file  Social Needs  . Financial resource strain: Not on file  . Food insecurity:    Worry: Not on file    Inability: Not on file  . Transportation needs:    Medical: Not on file    Non-medical: Not on file  Tobacco Use  . Smoking status: Never Smoker  . Smokeless tobacco: Never Used  Substance  and Sexual Activity  . Alcohol use: Yes    Comment: occasional  . Drug use: No    Types: Marijuana  . Sexual activity: Yes  Lifestyle  . Physical activity:    Days per week: Not on file    Minutes per session: Not on file  . Stress: Not on file  Relationships  . Social connections:    Talks on phone: Not on file    Gets together: Not on file    Attends religious service: Not on file    Active member of club or organization: Not on file    Attends meetings of clubs or organizations: Not on file    Relationship status: Not on file  . Intimate partner violence:    Fear of current or ex partner: Not on  file    Emotionally abused: Not on file    Physically abused: Not on file    Forced sexual activity: Not on file  Other Topics Concern  . Not on file  Social History Narrative  . Not on file    No family history on file.   ROS  Vitals:   05/21/18 1122  BP: 120/83  Pulse: 89  Resp: 16  Temp: 97.9 F (36.6 C)  SpO2: 98%    Physical Exam  Constitutional: He is oriented to person, place, and time. He appears well-developed and well-nourished.  HENT:  Head: Normocephalic.  Eyes: Pupils are equal, round, and reactive to light. EOM are normal.  Neck: Normal range of motion. Neck supple.  Cardiovascular: Normal rate and regular rhythm.  Pulmonary/Chest: Effort normal and breath sounds normal.  Musculoskeletal:       Cervical back: Normal.       Thoracic back: Normal.       Lumbar back: Normal.  Left shoulder: Nontender, full range of motion, within normal limits. Left: Positive tenderness and spasm to trapezius muscle.  Neurological: He is alert and oriented to person, place, and time.  Skin: Skin is warm and dry.  Vitals reviewed.  A total of 25 minutes was spent in the room with the patient, greater than 50% of which was in counseling/coordination of care regarding diagnosis, treatment, medications, and need for follow-up with orthopedist.   ASSESSMENT & PLAN: [name] was seen today for shoulder pain.  Diagnoses and all orders for this visit:  Strain of left trapezius muscle, sequela -     Ambulatory referral to Orthopedic Surgery  Muscle spasm of left shoulder -     cyclobenzaprine (FLEXERIL) 10 MG tablet; Take 1 tablet (10 mg total) by mouth 3 (three) times daily as needed for muscle spasms.  Musculoskeletal pain -     HYDROcodone-acetaminophen (NORCO) 5-325 MG tablet; Take 1 tablet by mouth at bedtime as needed for moderate pain.    Patient Instructions       If you have lab work done today you will be contacted with your lab results within the next 2  weeks.  If you have not heard from [place] then please contact us. The fastest way to get your results is to register for My Chart.   IF you received an x-ray today, you will receive an invoice from [facility]. Please contact [facility] at [phone] with questions or concerns regarding your invoice.   IF you received labwork today, you will receive an invoice from [place]. Please contact [facility] at [phone] with questions or concerns regarding your invoice.   Our billing staff will not be able to  assist you with questions regarding bills from these companies.  You will be contacted with the lab results as soon as they are available. The fastest way to get your results is to activate your My Chart account. Instructions are located on the last page of this paperwork. If you have not heard from us regarding the results in 2 weeks, please contact this office.     Muscle Strain A muscle strain (pulled muscle) happens when a muscle is stretched beyond normal length. It happens when a sudden, violent force stretches your muscle too far. Usually, a few of the fibers in your muscle are torn. Muscle strain is common in athletes. Recovery usually takes 1-2 weeks. Complete healing takes 5-6 weeks. Follow these instructions at home:  Follow the PRICE method of treatment to help your injury get better. Do this the first 2-3 days after the injury: ? Protect. Protect the muscle to keep it from getting injured again. ? Rest. Limit your activity and rest the injured body part. ? Ice. Put ice in a plastic bag. Place a towel between your skin and the bag. Then, apply the ice and leave it on from 15-20 minutes each hour. After the third day, switch to moist heat packs. ? Compression. Use a splint or elastic bandage on the injured area for comfort. Do not put it on too tightly. ? Elevate. Keep the injured body part above the level of your heart.  Only take medicine as told by your  doctor.  Warm up before doing exercise to prevent future muscle strains. Contact a doctor if:  You have more pain or puffiness (swelling) in the injured area.  You feel numbness, tingling, or notice a loss of strength in the injured area. This information is not intended to replace advice given to you by your health care provider. Make sure you discuss any questions you have with your health care provider. Document Released: 03/11/2008 Document Revised: 11/08/2015 Document Reviewed: 12/30/2012 Elsevier Interactive Patient Education  2017 Elsevier Inc.      Edwina BarthMiguel Milica Gully, [facility]

## 2018-05-21 NOTE — Patient Instructions (Addendum)
     If you have lab work done today you will be contacted with your lab results within the next 2 weeks.  If you have not heard from us then please contact us. The fastest way to get your results is to register for My Chart.   IF you received an x-ray today, you will receive an invoice from [facility]. Please contact [facility] at [phone] with questions or concerns regarding your invoice.   IF you received labwork today, you will receive an invoice from [facility]. Please contact [facility] at [phone] with questions or concerns regarding your invoice.   Our billing staff will not be able to assist you with questions regarding bills from these companies.  You will be contacted with the lab results as soon as they are available. The fastest way to get your results is to activate your My Chart account. Instructions are located on the last page of this paperwork. If you have not heard from us regarding the results in 2 weeks, please contact this office.     Muscle Strain A muscle strain (pulled muscle) happens when a muscle is stretched beyond normal length. It happens when a sudden, violent force stretches your muscle too far. Usually, a few of the fibers in your muscle are torn. Muscle strain is common in athletes. Recovery usually takes 1-2 weeks. Complete healing takes 5-6 weeks. Follow these instructions at home:  Follow the PRICE method of treatment to help your injury get better. Do this the first 2-3 days after the injury: ? Protect. Protect the muscle to keep it from getting injured again. ? Rest. Limit your activity and rest the injured body part. ? Ice. Put ice in a plastic bag. Place a towel between your skin and the bag. Then, apply the ice and leave it on from 15-20 minutes each hour. After the third day, switch to moist heat packs. ? Compression. Use a splint or elastic bandage on the injured area for comfort. Do not put it on too tightly. ? Elevate.  Keep the injured body part above the level of your heart.  Only take medicine as told by your doctor.  Warm up before doing exercise to prevent future muscle strains. Contact a doctor if:  You have more pain or puffiness (swelling) in the injured area.  You feel numbness, tingling, or notice a loss of strength in the injured area. This information is not intended to replace advice given to you by your health care provider. Make sure you discuss any questions you have with your health care provider. Document Released: 03/11/2008 Document Revised: 11/08/2015 Document Reviewed: 12/30/2012 Elsevier Interactive Patient Education  2017 [employer]

## 2018-05-21 NOTE — Addendum Note (Signed)
Addended by: Georg RuddleJOYCE, Cristian Grieves A on: 05/21/2018 11:53 AM   Modules accepted: Kipp BroodSmartSet

## 2018-05-26 ENCOUNTER — Ambulatory Visit: Payer: BLUE CROSS/BLUE SHIELD | Attending: Physician Assistant | Admitting: Physical Therapy

## 2018-05-26 ENCOUNTER — Encounter: Payer: Self-pay | Admitting: Physical Therapy

## 2018-05-26 DIAGNOSIS — R293 Abnormal posture: Secondary | ICD-10-CM

## 2018-05-26 DIAGNOSIS — M62838 Other muscle spasm: Secondary | ICD-10-CM | POA: Insufficient documentation

## 2018-05-26 NOTE — Therapy (Signed)
[facility] [address] [place], [place], [address] Phone: [phone]   Fax:  [phone]  Physical Therapy Treatment  Patient Details  Name: Anthony Watkins MRN: [id number] Date of Birth: [birth year] Referring Provider (PT): Benjiman Core PA-C   Encounter Date: 05/26/2018  PT End of Session - 05/26/18 1147    Visit Number  2    Number of Visits  7    Date for PT Re-Evaluation  06/23/18    PT Start Time  1147    PT Stop Time  1232    PT Time Calculation (min)  45 min    Activity Tolerance  Patient tolerated treatment well    Behavior During Therapy  [facility] for tasks assessed/performed       Past Medical History:  Diagnosis Date  . History of kidney stones     Past Surgical History:  Procedure Laterality Date  . adnoids    . KNEE ARTHROSCOPY WITH ANTERIOR CRUCIATE LIGAMENT (ACL) REPAIR Right 07/19/2013   Procedure: RIGHT KNEE ARTHROSCOPY WITH ANTERIOR CRUCIATE LIGAMENT (ACL) REPAIR;  Surgeon: Velna Ochs, MD;  [facility];  Service: Orthopedics;  Laterality: Right;  . WISDOM TOOTH EXTRACTION      There were no vitals filed for this visit.  Subjective Assessment - 05/26/18 1147    Subjective  "I am still feeling sore, I did see the MD and he gave me more medication"    Currently in Pain?  Yes    Pain Score  3    6/10 with sleeping   Pain Location  Shoulder    Pain Orientation  Right    Pain Descriptors / Indicators  Aching;Sore    Pain Type  Chronic pain    Pain Onset  More than a month ago    Pain Frequency  Intermittent    Aggravating Factors   laying down at night                       [facility] Adult PT Treatment/Exercise - 05/26/18 0001      Exercises   Exercises  Shoulder      Shoulder Exercises: Seated   Horizontal ABduction  15 reps;Theraband;Strengthening;Both   with green band   Other Seated Exercises  serratus push-up 1 x 10 holding 1-2 seconds      Other Seated Exercises  lower trap wall y's 1 x 15      Shoulder Exercises: Stretch   Other Shoulder Stretches  rhomboid holding onto bar with arms crossed 2 x 30 sec    Other Shoulder Stretches  upper trap strech 3 x 30 contract / relax      Manual Therapy   Manual Therapy  Soft tissue mobilization;Other (comment);Joint mobilization    Manual therapy comments  skilled palpation and monitoring of pt throughout TPDN    Joint Mobilization  L first rib mob grade III, T1-T8 PA grade IV    Soft tissue mobilization  IASTM along L upper trap/ levator scapulae    Other Manual Therapy  upper trap inhibition taping onthe L      Neck Exercises: Stretches   Levator Stretch  2 reps;30 seconds;Left       Trigger Point Dry Needling - 05/26/18 1158    Consent Given?  Yes    Education Handout Provided  No    Upper Trapezius Response  Twitch reponse elicited;Palpable increased muscle length    Levator Scapulae Response  Twitch  response elicited;Palpable increased muscle length             PT Short Term Goals - 05/12/18 1107      PT SHORT TERM GOAL #1   Title  pt to be I with inital HEP     Time  3    Period  Weeks    Status  New    Target Date  06/02/18        PT Long Term Goals - 05/12/18 1108      PT LONG TERM GOAL #1   Title  report decreased pain and stiffness in the l upper trap and surrounding musculature to </= 2/10 pain     Time  6    Period  Weeks    Status  New    Target Date  06/23/18      PT LONG TERM GOAL #2   Title  pt to report sleeping throughout the night with </= 1/10 pain     Time  6    Period  Weeks    Status  New    Target Date  06/23/18      PT LONG TERM GOAL #3   Title  pt to be able to lift, carrying, push and pull as it related to work with no report of pain or stiffness     Time  6    Period  Weeks    Status  New    Target Date  06/23/18      PT LONG TERM GOAL #4   Title  increase FOTO score to </= 23% limited to demo improvement in function     Time  6    Period  Weeks    Status  New    Target Date  06/23/18      PT LONG TERM GOAL #5   Title  pt to be I with all HEP given as of last visit to maintain and progress current level of function    Time  6    Period  Weeks    Target Date  06/23/18            Plan - 05/26/18 1239    Clinical Impression Statement  pt reports continued pain in the shoulder but does report decreased LUE referral symptoms. continued TPDN along the upper trap/ levator scapule, infraspinatus and sub-scapularis followed with IASTM. continued stretching and strengthening of the scapular stabilizers. He reported feeling muscle soreness but no pain end of session.     Rehab Potential  Good    PT Treatment/Interventions  ADLs/Self Care Home Management;Cryotherapy;Electrical Stimulation;Iontophoresis 4mg /ml Dexamethasone;Moist Heat;Ultrasound;Neuromuscular re-education;Therapeutic activities;Therapeutic exercise;Manual techniques;Taping;Dry needling;Passive range of motion;Patient/family education    PT Next Visit Plan  update HEP, how was DN, STW, shoulder stretching, posture education     PT Home Exercise Plan  upper trap/ levator scapulae, rhomboid stretching. lower trap wall y's, horizontal abduction, serratus punch    Consulted and Agree with Plan of Care  Patient       Patient will benefit from skilled therapeutic intervention in order to improve the following deficits and impairments:  Pain, Increased fascial restricitons, Improper body mechanics, Postural dysfunction, Decreased endurance, Decreased activity tolerance, Increased muscle spasms  Visit Diagnosis: Other muscle spasm  Abnormal posture     Problem List Patient Active Problem List   Diagnosis Date Noted  . Hx of anterior cruciate ligament surgery 05/03/2014   Anthony RidingKristoffer Leamon PT, DPT, LAT, ATC  05/26/18  12:47 PM  [facility] [address] [place], [place],  [address] Phone: [phone]   Fax:  [phone]  Name: Anthony Watkins MRN: [id number] Date of Birth: 07/21/1975

## 2018-06-02 ENCOUNTER — Ambulatory Visit: Payer: BLUE CROSS/BLUE SHIELD | Admitting: Physical Therapy

## 2018-06-02 DIAGNOSIS — M62838 Other muscle spasm: Secondary | ICD-10-CM

## 2018-06-02 DIAGNOSIS — R293 Abnormal posture: Secondary | ICD-10-CM | POA: Diagnosis not present

## 2018-06-02 NOTE — Therapy (Addendum)
[place], [place], [address] Phone: [phone]   Fax:  [phone]  Physical Therapy Treatment / Discharge note  Patient Details  Name: Anthony Watkins MRN: [id number] Date of Birth: [birth year] Referring Provider (PT): Tenna Delaine PA-C   Encounter Date: 06/02/2018  PT End of Session - 06/02/18 1015    Visit Number  3    Number of Visits  7    Date for PT Re-Evaluation  06/23/18    PT Start Time  9449    PT Stop Time  1058    PT Time Calculation (min)  43 min    Activity Tolerance  Patient tolerated treatment well    Behavior During Therapy  [facility] for tasks assessed/performed       Past Medical History:  Diagnosis Date  . History of kidney stones     Past Surgical History:  Procedure Laterality Date  . adnoids    . KNEE ARTHROSCOPY WITH ANTERIOR CRUCIATE LIGAMENT (ACL) REPAIR Right 07/19/2013   Procedure: RIGHT KNEE ARTHROSCOPY WITH ANTERIOR CRUCIATE LIGAMENT (ACL) REPAIR;  Surgeon: Hessie Dibble, MD;  Location: [place];  Service: Orthopedics;  Laterality: Right;  . WISDOM TOOTH EXTRACTION      There were no vitals filed for this visit.  Subjective Assessment - 06/02/18 1016    Subjective  "I was feeling better after the last session, I did start noticing increased soreness beginning last night"    Currently in Pain?  Yes    Pain Score  4     Pain Location  Shoulder    Pain Orientation  Right    Pain Descriptors / Indicators  Sore;Aching    Pain Onset  More than a month ago    Pain Frequency  Intermittent    Aggravating Factors   doing too much at home                       [facility] Adult PT Treatment/Exercise - 06/02/18 1020      Self-Care   Self-Care  Other Self-Care Comments    Other Self-Care Comments   how to peform MTPR using tools to calm down pain/ tightness      Shoulder Exercises: Standing   Protraction  Strengthening;Both;15 reps   1 x  from the wall, from the counter   External Rotation  Left;15 reps;Theraband    Theraband Level (Shoulder External Rotation)  Level 4 (Blue)    Internal Rotation  Strengthening;15 reps;Theraband;Left    Theraband Level (Shoulder Internal Rotation)  Level 4 (Blue)    Extension  15 reps;Theraband;Strengthening;Both    Theraband Level (Shoulder Extension)  Level 4 (Blue)    Row  15 reps;Theraband;Both    Theraband Level (Shoulder Row)  Level 4 (Blue)    Other Standing Exercises  shoulder scaption 2 x 10 with 2#   demonstration for proper form   Other Standing Exercises  lower trap wall y's with red band around the wrist 1 x 15      Shoulder Exercises: ROM/Strengthening   UBE (Upper Arm Bike)  L3 x 6 min    changing direction at 3 min     Manual Therapy   Manual therapy comments  MTPR along the upper trap             PT Education - 06/02/18 1058    Education Details  updated HEP for shoulder strengthening. self MTPR techniques  Person(s) Educated  Patient    Methods  Explanation;Demonstration;Verbal cues;Handout    Comprehension  Verbalized understanding;Returned demonstration       PT Short Term Goals - 05/12/18 1107      PT SHORT TERM GOAL #1   Title  pt to be I with inital HEP     Time  3    Period  Weeks    Status  New    Target Date  06/02/18        PT Long Term Goals - 05/12/18 1108      PT LONG TERM GOAL #1   Title  report decreased pain and stiffness in the l upper trap and surrounding musculature to </= 2/10 pain     Time  6    Period  Weeks    Status  New    Target Date  06/23/18      PT LONG TERM GOAL #2   Title  pt to report sleeping throughout the night with </= 1/10 pain     Time  6    Period  Weeks    Status  New    Target Date  06/23/18      PT LONG TERM GOAL #3   Title  pt to be able to lift, carrying, push and pull as it related to work with no report of pain or stiffness     Time  6    Period  Weeks    Status  New    Target Date   06/23/18      PT LONG TERM GOAL #4   Title  increase FOTO score to </= 23% limited to demo improvement in function    Time  6    Period  Weeks    Status  New    Target Date  06/23/18      PT LONG TERM GOAL #5   Title  pt to be I with all HEP given as of last visit to maintain and progress current level of function    Time  6    Period  Weeks    Target Date  06/23/18            Plan - 06/02/18 1016    Clinical Impression Statement  continued on MTPR focusing on self care and educated on techniques that can be done at home. 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 He reported no pain follow the session noting only fatigue. updated HEP for shoulder strengthening.     PT Treatment/Interventions  ADLs/Self Care Home Management;Cryotherapy;Electrical Stimulation;Iontophoresis 96m/ml Dexamethasone;Moist Heat;Ultrasound;Neuromuscular re-education;Therapeutic activities;Therapeutic exercise;Manual techniques;Taping;Dry needling;Passive range of motion;Patient/family education    PT Next Visit Plan  update HEP, how was DN, STW, shoulder stretching, posture education     PT Home Exercise Plan  upper trap/ levator scapulae, rhomboid stretching. lower trap wall y's, horizontal abduction, serratus punch    Consulted and Agree with Plan of Care  Patient       Patient will benefit from skilled therapeutic intervention in order to improve the following deficits and impairments:  Pain, Increased fascial restricitons, Improper body mechanics, Postural dysfunction, Decreased endurance, Decreased activity tolerance, Increased muscle spasms  Visit Diagnosis: Other muscle spasm  Abnormal posture     Problem List Patient Active Problem List   Diagnosis Date Noted  . Hx of anterior cruciate ligament surgery 05/03/2014   KStarr LakePT, DPT, LAT, ATC  06/02/18  11:03 AM  [place], [place], [address] Phone: [phone]   Fax:  [phone]  Name: Anthony Watkins MRN: [id number] Date of Birth: 1975-11-03      PHYSICAL THERAPY DISCHARGE SUMMARY  Visits from Start of Care: 3  Current functional level related to goals / functional outcomes: See notes   Remaining deficits: Pt called and cancelled all appointments   Education / Equipment: HEP  Plan: Patient agrees to discharge.  Patient goals were not met. Patient is being discharged due to the patient's request.  ?????         Heylee Tant PT, DPT, LAT, ATC  07/06/18  2:15 PM

## 2018-06-07 ENCOUNTER — Ambulatory Visit: Payer: BLUE CROSS/BLUE SHIELD | Admitting: Physical Therapy

## 2018-06-14 ENCOUNTER — Ambulatory Visit: Payer: BLUE CROSS/BLUE SHIELD | Admitting: Physical Therapy

## 2018-07-06 DIAGNOSIS — H61001 Unspecified perichondritis of right external ear: Secondary | ICD-10-CM | POA: Diagnosis not present

## 2019-04-13 ENCOUNTER — Encounter: Payer: Self-pay | Admitting: *Deleted
